# Patient Record
Sex: Female | Born: 1965 | Race: Black or African American | Hispanic: No | Marital: Married | Smoking: Current every day smoker
Health system: Southern US, Community
[De-identification: ages and names within clinical notes are randomized; demographics above are authoritative.]

---

## 2002-01-24 ENCOUNTER — Emergency Department (HOSPITAL_COMMUNITY): Admission: EM | Admit: 2002-01-24 | Discharge: 2002-01-24 | Payer: Self-pay | Admitting: Emergency Medicine

## 2014-10-17 ENCOUNTER — Inpatient Hospital Stay (HOSPITAL_COMMUNITY)
Admission: EM | Admit: 2014-10-17 | Discharge: 2014-10-19 | DRG: 418 | Disposition: A | Discharge status: Home / Self Care | Payer: BLUE CROSS/BLUE SHIELD | Attending: Internal Medicine | Admitting: Internal Medicine

## 2014-10-17 ENCOUNTER — Inpatient Hospital Stay (HOSPITAL_COMMUNITY): Payer: BLUE CROSS/BLUE SHIELD

## 2014-10-17 ENCOUNTER — Emergency Department (HOSPITAL_COMMUNITY): Payer: BLUE CROSS/BLUE SHIELD

## 2014-10-17 ENCOUNTER — Encounter (HOSPITAL_COMMUNITY): Payer: Self-pay | Admitting: Emergency Medicine

## 2014-10-17 DIAGNOSIS — Z419 Encounter for procedure for purposes other than remedying health state, unspecified: Secondary | ICD-10-CM

## 2014-10-17 DIAGNOSIS — K8062 Calculus of gallbladder and bile duct with acute cholecystitis without obstruction: Secondary | ICD-10-CM | POA: Diagnosis present

## 2014-10-17 DIAGNOSIS — E669 Obesity, unspecified: Secondary | ICD-10-CM | POA: Diagnosis present

## 2014-10-17 DIAGNOSIS — K8040 Calculus of bile duct with cholecystitis, unspecified, without obstruction: Secondary | ICD-10-CM | POA: Diagnosis not present

## 2014-10-17 DIAGNOSIS — F17210 Nicotine dependence, cigarettes, uncomplicated: Secondary | ICD-10-CM | POA: Diagnosis present

## 2014-10-17 DIAGNOSIS — R1011 Right upper quadrant pain: Secondary | ICD-10-CM

## 2014-10-17 DIAGNOSIS — R109 Unspecified abdominal pain: Secondary | ICD-10-CM | POA: Diagnosis present

## 2014-10-17 DIAGNOSIS — K8042 Calculus of bile duct with acute cholecystitis without obstruction: Secondary | ICD-10-CM | POA: Diagnosis present

## 2014-10-17 DIAGNOSIS — R7989 Other specified abnormal findings of blood chemistry: Secondary | ICD-10-CM

## 2014-10-17 DIAGNOSIS — Z6841 Body Mass Index (BMI) 40.0 and over, adult: Secondary | ICD-10-CM | POA: Diagnosis not present

## 2014-10-17 DIAGNOSIS — R945 Abnormal results of liver function studies: Secondary | ICD-10-CM

## 2014-10-17 LAB — CBC WITH DIFFERENTIAL/PLATELET
Basophils Absolute: 0.1 10*3/uL (ref 0.0–0.1)
Basophils Relative: 1 % (ref 0–1)
EOS ABS: 0.5 10*3/uL (ref 0.0–0.7)
EOS PCT: 6 % — AB (ref 0–5)
HCT: 41.4 % (ref 36.0–46.0)
HEMOGLOBIN: 13.6 g/dL (ref 12.0–15.0)
LYMPHS PCT: 35 % (ref 12–46)
Lymphs Abs: 2.8 10*3/uL (ref 0.7–4.0)
MCH: 31.3 pg (ref 26.0–34.0)
MCHC: 32.9 g/dL (ref 30.0–36.0)
MCV: 95.4 fL (ref 78.0–100.0)
Monocytes Absolute: 0.8 10*3/uL (ref 0.1–1.0)
Monocytes Relative: 10 % (ref 3–12)
NEUTROS ABS: 3.8 10*3/uL (ref 1.7–7.7)
NEUTROS PCT: 48 % (ref 43–77)
PLATELETS: 223 10*3/uL (ref 150–400)
RBC: 4.34 MIL/uL (ref 3.87–5.11)
RDW: 13.9 % (ref 11.5–15.5)
WBC: 8 10*3/uL (ref 4.0–10.5)

## 2014-10-17 LAB — COMPREHENSIVE METABOLIC PANEL
ALK PHOS: 163 U/L — AB (ref 38–126)
ALT: 515 U/L — AB (ref 14–54)
AST: 1012 U/L — AB (ref 15–41)
Albumin: 3.9 g/dL (ref 3.5–5.0)
Anion gap: 8 (ref 5–15)
BILIRUBIN TOTAL: 1.9 mg/dL — AB (ref 0.3–1.2)
BUN: 10 mg/dL (ref 6–20)
CALCIUM: 9.7 mg/dL (ref 8.9–10.3)
CO2: 27 mmol/L (ref 22–32)
Chloride: 104 mmol/L (ref 101–111)
Creatinine, Ser: 0.97 mg/dL (ref 0.44–1.00)
GFR calc non Af Amer: >60 mL/min (ref >60)
Glucose, Bld: 95 mg/dL (ref 65–99)
POTASSIUM: 4.1 mmol/L (ref 3.5–5.1)
Sodium: 139 mmol/L (ref 135–145)
TOTAL PROTEIN: 7.7 g/dL (ref 6.5–8.1)

## 2014-10-17 LAB — URINALYSIS, ROUTINE W REFLEX MICROSCOPIC
Glucose, UA: NEGATIVE mg/dL
Hgb urine dipstick: NEGATIVE
Ketones, ur: NEGATIVE mg/dL
Leukocytes, UA: NEGATIVE
Nitrite: NEGATIVE
PROTEIN: NEGATIVE mg/dL
Specific Gravity, Urine: 1.014 (ref 1.005–1.030)
UROBILINOGEN UA: 1 mg/dL (ref 0.0–1.0)
pH: 7.5 (ref 5.0–8.0)

## 2014-10-17 LAB — I-STAT TROPONIN, ED: TROPONIN I, POC: 0 ng/mL (ref 0.00–0.08)

## 2014-10-17 LAB — CREATININE, SERUM
Creatinine, Ser: 0.91 mg/dL (ref 0.44–1.00)
GFR calc Af Amer: >60 mL/min (ref >60)
GFR calc non Af Amer: >60 mL/min (ref >60)

## 2014-10-17 LAB — PREGNANCY, URINE
Preg Test, Ur: NEGATIVE
Preg Test, Ur: NEGATIVE

## 2014-10-17 LAB — ETHANOL

## 2014-10-17 LAB — PROTIME-INR
INR: 1.16 (ref 0.00–1.49)
Prothrombin Time: 15 seconds (ref 11.6–15.2)

## 2014-10-17 LAB — GAMMA GT: GGT: 280 U/L — ABNORMAL HIGH (ref 7–50)

## 2014-10-17 LAB — LIPASE, BLOOD: LIPASE: 23 U/L (ref 22–51)

## 2014-10-17 MED ORDER — GADOBENATE DIMEGLUMINE 529 MG/ML IV SOLN
20.0000 mL | Freq: Once | INTRAVENOUS | Status: AC | PRN
  Administered 2014-10-17: 20 mL via INTRAVENOUS

## 2014-10-17 MED ORDER — ONDANSETRON HCL 4 MG/2ML IJ SOLN: 4.0000 mg | Freq: Four times a day (QID) | INTRAMUSCULAR | Status: DC | PRN

## 2014-10-17 MED ORDER — HEPARIN SODIUM (PORCINE) 5000 UNIT/ML IJ SOLN
5000.0000 [IU] | Freq: Three times a day (TID) | INTRAMUSCULAR | Status: DC
  Administered 2014-10-17 – 2014-10-18 (×2): 5000 [IU] via SUBCUTANEOUS
  Filled 2014-10-17 (×5): qty 1

## 2014-10-17 MED ORDER — SODIUM CHLORIDE 0.9 % IV BOLUS (SEPSIS)
1000.0000 mL | Freq: Once | INTRAVENOUS | Status: AC
  Administered 2014-10-17: 1000 mL via INTRAVENOUS

## 2014-10-17 MED ORDER — SODIUM CHLORIDE 0.9 % IV SOLN
3.0000 g | Freq: Four times a day (QID) | INTRAVENOUS | Status: DC
  Administered 2014-10-17 – 2014-10-18 (×3): 3 g via INTRAVENOUS
  Filled 2014-10-17 (×5): qty 3

## 2014-10-17 MED ORDER — ONDANSETRON HCL 4 MG PO TABS: 4.0000 mg | ORAL_TABLET | Freq: Four times a day (QID) | ORAL | Status: DC | PRN

## 2014-10-17 MED ORDER — GUAIFENESIN-DM 100-10 MG/5ML PO SYRP: 5.0000 mL | ORAL_SOLUTION | ORAL | Status: DC | PRN

## 2014-10-17 MED ORDER — MORPHINE SULFATE 2 MG/ML IJ SOLN
2.0000 mg | INTRAMUSCULAR | Status: DC | PRN
  Administered 2014-10-18: 2 mg via INTRAVENOUS
  Filled 2014-10-17: qty 1

## 2014-10-17 MED ORDER — KCL IN DEXTROSE-NACL 10-5-0.45 MEQ/L-%-% IV SOLN
INTRAVENOUS | Status: DC
  Administered 2014-10-17: 23:00:00 via INTRAVENOUS
  Filled 2014-10-17 (×4): qty 1000

## 2014-10-17 NOTE — H&P (Signed)
Patient Demographics  Nicole Ross, is a 49 y.o. female  MRN: [id number]   DOB - 17-Apr-1966  Admit Date - 10/17/2014  Outpatient Primary MD for the patient is No PCP Per Patient   With History of -  History reviewed. No pertinent past medical history.    History reviewed. No pertinent past surgical history.  in for   Chief Complaint  Patient presents with  . Abdominal Pain     HPI  Nicole Ross  is a 49 y.o. female,  with no previous medical history but positive history of smoking, marijuana use and occasional alcohol use counseled to quit all who is in good state of health but started having epigastric abdominal pain sometime yesterday nitrate into her back, no exacerbating or relieving factors, denies any fever chills, no diarrhea nausea vomiting, symptoms continued so she came to the ER.  In the ER workup consistent with possible stone in the CBD versus passed stone, she had elevated liver enzymes, Gen. surgery was consulted who requested a GI input, GI Dr. Bosie Clos was consulted who requested a hospitalist admission.   Patient currently symptom free except for dull epigastric abdominal pain radiating to her back, pain is constant, started yesterday evening, no aggravating or relieving factors, associate with mild nausea. She denies any other medical issues, or take any prescription medications. Otherwise negative review of systems.    Review of Systems    In addition to the HPI above,  No Fever-chills, No Headache, No changes with Vision or hearing, No problems swallowing food or Liquids, No Chest pain, Cough or Shortness of Breath, As above Abdominal pain, No Nausea or Vommitting, Bowel movements are regular, No Blood in stool or Urine, No dysuria, No new skin rashes or bruises, No new joints  pains-aches,  No new weakness, tingling, numbness in any extremity, No recent weight gain or loss, No polyuria, polydypsia or polyphagia, No significant Mental Stressors.  A full 10 point Review of Systems was done, except as stated above, all other Review of Systems were negative.   Social History History  Substance Use Topics  . Smoking status: Current Every Day Smoker  . Smokeless tobacco: Not on file  . Alcohol Use: Yes    Lives - at home  Mobility - walks without assistance     Family History Mother died of acute pancreatitis  Prior to Admission medications   Not on File    No Known Allergies  Physical Exam  Vitals  Blood pressure 139/86, pulse 62, temperature 98.3 F (36.8 C), temperature source Oral, resp. rate 16, last menstrual period 10/03/2014, SpO2 100 %.   1. General obese middle-aged African-American female lying in bed in NAD,     2. Normal affect and insight, Not Suicidal or Homicidal, Awake Alert, Oriented X 3.  3. No F.N deficits, ALL C.Nerves Intact, Strength 5/5 all 4 extremities, Sensation intact all 4 extremities, Plantars down going.  4. Ears and Eyes appear Normal, Conjunctivae clear, PERRLA. Moist Oral Mucosa.  5. Supple Neck, No JVD, No cervical lymphadenopathy appriciated, No Carotid Bruits.  6. Symmetrical Chest wall movement, Good air movement bilaterally, CTAB.  7. RRR, No Gallops, Rubs or Murmurs, No Parasternal Heave.  8. Positive Bowel Sounds, Abdomen Soft, mild right upper quadrant tenderness, No organomegaly appriciated,No rebound -guarding or rigidity.  9.  No Cyanosis, Normal Skin Turgor, No Skin Rash or Bruise.  10. Good muscle tone,  joints appear normal , no effusions, Normal ROM.  11. No Palpable Lymph Nodes in Neck or Axillae     Data Review  CBC  Recent Labs Lab 10/17/14 1234  WBC 8.0  HGB 13.6  HCT 41.4  PLT 223  MCV 95.4  MCH 31.3  MCHC 32.9  RDW 13.9  LYMPHSABS 2.8  MONOABS 0.8  EOSABS 0.5    BASOSABS 0.1   ------------------------------------------------------------------------------------------------------------------  Chemistries   Recent Labs Lab 10/17/14 1234  NA 139  K 4.1  CL 104  CO2 27  GLUCOSE 95  BUN 10  CREATININE 0.97  CALCIUM 9.7  AST 1012*  ALT 515*  ALKPHOS 163*  BILITOT 1.9*   ------------------------------------------------------------------------------------------------------------------ CrCl cannot be calculated (Unknown ideal weight.). ------------------------------------------------------------------------------------------------------------------ No results for input(s): TSH, T4TOTAL, T3FREE, THYROIDAB in the last 72 hours.  Invalid input(s): FREET3   Coagulation profile No results for input(s): INR, PROTIME in the last 168 hours. ------------------------------------------------------------------------------------------------------------------- No results for input(s): DDIMER in the last 72 hours. -------------------------------------------------------------------------------------------------------------------  Cardiac Enzymes No results for input(s): CKMB, TROPONINI, MYOGLOBIN in the last 168 hours.  Invalid input(s): CK ------------------------------------------------------------------------------------------------------------------ Invalid input(s): POCBNP   ---------------------------------------------------------------------------------------------------------------  Urinalysis    Component Value Date/Time   COLORURINE AMBER* 10/17/2014 1209   APPEARANCEUR CLEAR 10/17/2014 1209   LABSPEC 1.014 10/17/2014 1209   PHURINE 7.5 10/17/2014 1209   GLUCOSEU NEGATIVE 10/17/2014 1209   HGBUR NEGATIVE 10/17/2014 1209   BILIRUBINUR SMALL* 10/17/2014 1209   KETONESUR NEGATIVE 10/17/2014 1209   PROTEINUR NEGATIVE 10/17/2014 1209   UROBILINOGEN 1.0 10/17/2014 1209   NITRITE NEGATIVE 10/17/2014 1209   LEUKOCYTESUR NEGATIVE  10/17/2014 1209    ----------------------------------------------------------------------------------------------------------------  Imaging results:   US Abdomen Complete  10/17/2014   CLINICAL DATA:  Abdominal pain since 10 p.m., elevated LFTs  EXAM: ULTRASOUND ABDOMEN COMPLETE  COMPARISON:  None.  FINDINGS: Gallbladder: Multiple gallstones measuring up to 1.5 cm. Mild gallbladder wall thickening. No pericholecystic fluid. Negative sonographic Murphy's sign.  Common bile duct: Diameter: 9 mm. Although not well visualized, there is possible debris/stones in the mid/distal common duct (image 29).  Liver: No focal lesion identified. Within normal limits in parenchymal echogenicity.  IVC: No abnormality visualized.  Pancreas: Visualized portion unremarkable.  Spleen: Size and appearance within normal limits.  Right Kidney: Length: 11.3 cm.  No mass or hydronephrosis.  Left Kidney: Length: 12.0 cm.  No mass or hydronephrosis.  Abdominal aorta: No aneurysm visualized.  Other findings: None.  IMPRESSION: Cholelithiasis with mild gallbladder wall thickening. No associated sonographic findings suggest acute cholecystitis.  Common duct is mildly prominent, measuring 9 mm proximally. No intrahepatic ductal dilatation.  Possible debris/stones in the mid/distal common duct, suggesting choledocholithiasis. In the setting of abnormal LFTs, consider ERCP or MRCP.   Electronically Signed   By: Charline Bills M.D.   On: 10/17/2014 16:17    My personal review of EKG: Rhythm NSR, Rate  60 /min, no Acute ST changes    Assessment & Plan  1. Abdominal pain secondary to  choledocholithiasis versus passed CBD stone. She will be able to a MedSurg bed, nothing by mouth, IV fluids and pain medications. Will get MRCP. GI to evaluate. For now place her on IV Unasyn. May need eventual ERCP and cholecystectomy.  Will check a urine pregnancy is kindly follow.   DVT Prophylaxis Heparin    AM Labs Ordered, also please  review Full Orders  Family Communication: Admission, patients condition and plan of care including tests being ordered have been discussed with the patient and daughter who indicate understanding and agree with the plan and Code Status.  Code Status Full  Likely DC to home  Condition Fair  Time spent in minutes : 35    Rahel Carlton K M.D on 10/17/2014 at 5:46 PM  Between 7am to 7pm - Pager - [phone]  After 7pm go to [email or web address] - password [email or web address]  [facility]  Office  [phone]

## 2014-10-17 NOTE — ED Provider Notes (Signed)
CSN: [id number]     Arrival date & time 10/17/14  1208 History   First MD Initiated Contact with Patient 10/17/14 1319     Chief Complaint  Patient presents with  . Abdominal Pain     (Consider location/radiation/quality/duration/timing/severity/associated sxs/prior Treatment) HPI Nicole Ross is a 49 year old female with no known past medical history who presents the ER complaining of right upper quadrant and epigastric abdominal pain. Patient reports last night she had a gradual onset of right upper quadrant and epigastric abdominal pain with associated vomiting. Patient reports the pain began gradually and was constant, she reports gradual worsening of it throughout the night. Patient reports several episodes of bilious vomiting this morning. Patient states throughout the day today after vomiting her pain has decreased. On exam, patient states her pain is tolerable at this time, however still slightly present. Patient denies any aggravating or alleviating factors of her pain. Patient reports associated nausea and vomiting, denies associated chest pain, shortness of breath, fever, diarrhea, constipation, dysuria.  History reviewed. No pertinent past medical history. History reviewed. No pertinent past surgical history. No family history on file. History  Substance Use Topics  . Smoking status: Current Every Day Smoker  . Smokeless tobacco: Not on file  . Alcohol Use: Yes   OB History    No data available     Review of Systems  Constitutional: Negative for fever.  HENT: Negative for trouble swallowing.   Eyes: Negative for visual disturbance.  Respiratory: Negative for shortness of breath.   Cardiovascular: Negative for chest pain.  Gastrointestinal: Positive for nausea, vomiting and abdominal pain.  Genitourinary: Negative for dysuria.  Musculoskeletal: Negative for neck pain.  Skin: Negative for rash.  Neurological: Negative for dizziness, weakness and numbness.   Psychiatric/Behavioral: Negative.       Allergies  Review of patient's allergies indicates no known allergies.  Home Medications   Prior to Admission medications   Not on File   BP 139/86 mmHg  Pulse 62  Temp(Src) 98.3 F (36.8 C) (Oral)  Resp 16  SpO2 100%  LMP 10/03/2014 Physical Exam  Constitutional: She is oriented to person, place, and time. She appears well-developed and well-nourished. No distress.  HENT:  Head: Normocephalic and atraumatic.  Mouth/Throat: Oropharynx is clear and moist. No oropharyngeal exudate.  Eyes: Right eye exhibits no discharge. Left eye exhibits no discharge. No scleral icterus.  Neck: Normal range of motion.  Cardiovascular: Normal rate, regular rhythm and normal heart sounds.   No murmur heard. Pulmonary/Chest: Effort normal and breath sounds normal. No respiratory distress.  Abdominal: Soft. Normal appearance. There is tenderness in the right upper quadrant and epigastric area. There is positive Murphy's sign. There is no rigidity, no rebound, no guarding and no tenderness at McBurney's point.  Musculoskeletal: Normal range of motion. She exhibits no edema or tenderness.  Neurological: She is alert and oriented to person, place, and time. She has normal strength. No cranial nerve deficit or sensory deficit. Coordination normal. GCS eye subscore is 4. GCS verbal subscore is 5. GCS motor subscore is 6.  Patient fully alert, answering questions appropriately in full, clear sentences. Cranial nerves II through XII grossly intact. Motor strength 5 out of 5 in all major muscle groups of upper and lower extremities. Distal sensation intact.   Skin: Skin is warm and dry. No rash noted. She is not diaphoretic.  Psychiatric: She has a normal mood and affect.  Nursing note and vitals reviewed.   ED Course  Procedures (  including critical care time) Labs Review Labs Reviewed  CBC WITH DIFFERENTIAL/PLATELET - Abnormal; Notable for the following:     Eosinophils Relative 6 (*)    All other components within normal limits  COMPREHENSIVE METABOLIC PANEL - Abnormal; Notable for the following:    AST 1012 (*)    ALT 515 (*)    Alkaline Phosphatase 163 (*)    Total Bilirubin 1.9 (*)    All other components within normal limits  URINALYSIS, ROUTINE W REFLEX MICROSCOPIC - Abnormal; Notable for the following:    Color, Urine AMBER (*)    Bilirubin Urine SMALL (*)    All other components within normal limits  LIPASE, BLOOD  PREGNANCY, URINE  I-STAT TROPOININ, ED    Imaging Review US Abdomen Complete  10/17/2014   CLINICAL DATA:  Abdominal pain since 10 p.m., elevated LFTs  EXAM: ULTRASOUND ABDOMEN COMPLETE  COMPARISON:  None.  FINDINGS: Gallbladder: Multiple gallstones measuring up to 1.5 cm. Mild gallbladder wall thickening. No pericholecystic fluid. Negative sonographic Murphy's sign.  Common bile duct: Diameter: 9 mm. Although not well visualized, there is possible debris/stones in the mid/distal common duct (image 29).  Liver: No focal lesion identified. Within normal limits in parenchymal echogenicity.  IVC: No abnormality visualized.  Pancreas: Visualized portion unremarkable.  Spleen: Size and appearance within normal limits.  Right Kidney: Length: 11.3 cm.  No mass or hydronephrosis.  Left Kidney: Length: 12.0 cm.  No mass or hydronephrosis.  Abdominal aorta: No aneurysm visualized.  Other findings: None.  IMPRESSION: Cholelithiasis with mild gallbladder wall thickening. No associated sonographic findings suggest acute cholecystitis.  Common duct is mildly prominent, measuring 9 mm proximally. No intrahepatic ductal dilatation.  Possible debris/stones in the mid/distal common duct, suggesting choledocholithiasis. In the setting of abnormal LFTs, consider ERCP or MRCP.   Electronically Signed   By: Charline Bills M.D.   On: 10/17/2014 16:17     EKG Interpretation None      MDM   Final diagnoses:  RUQ pain   Patient here with  right upper quadrant tenderness, biliary vomiting, elevated liver enzymes. Bilirubin elevated at 1.9.   US abdomen complete with impression: Cholelithiasis with mild gallbladder wall thickening. No associated sonographic findings suggest acute cholecystitis.  Common duct is mildly prominent, measuring 9 mm proximally. No intrahepatic ductal dilatation.  Possible debris/stones in the mid/distal common duct, suggesting choledocholithiasis. In the setting of abnormal LFTs, consider ERCP or MRCP.  Based on these findings and patient's workup here, presentation suspicious for choledocholithiasis. Spoke with Dr. Ezzard Standing with general surgery who recommends gastroenterology consult for ERCP versus MRCP primarily with admission to medicine. Dr. Ezzard Standing recommends possible cholecystectomy status post ERCP.  Patient signed out to Roxy Horseman, PA-C with plan to follow-up with gastroenterology and admit patient to medicine for choledocholithiasis, elevated liver enzymes.  Signed,  Ladona Mow, PA-C 4:41 PM     Ladona Mow, PA-C 10/17/14 1641  Purvis Sheffield, MD 10/18/14 [phone]

## 2014-10-17 NOTE — ED Notes (Signed)
Delay with ultrasound per tech.

## 2014-10-17 NOTE — ED Notes (Signed)
US at bedside

## 2014-10-17 NOTE — Progress Notes (Signed)
ANTIBIOTIC CONSULT NOTE - INITIAL  Pharmacy Consult for Unasyn Indication: Intra-abdominal infection  No Known Allergies  Patient Measurements: Height: 5\' 3"  (160 cm) Weight: 276 lb (125.193 kg) IBW/kg (Calculated) : 52.4  Vital Signs: Temp: 99.2 F (37.3 C) (05/22 2251) Temp Source: Oral (05/22 2251) BP: 113/78 mmHg (05/22 2251) Pulse Rate: 64 (05/22 2251) Intake/Output from previous day:   Intake/Output from this shift:    Labs:  Recent Labs  10/17/14 1234 10/17/14 2015  WBC 8.0  --   HGB 13.6  --   PLT 223  --   CREATININE 0.97 0.91   Estimated Creatinine Clearance: 99.4 mL/min (by C-G formula based on Cr of 0.91). No results for input(s): VANCOTROUGH, VANCOPEAK, VANCORANDOM, GENTTROUGH, GENTPEAK, GENTRANDOM, TOBRATROUGH, TOBRAPEAK, TOBRARND, AMIKACINPEAK, AMIKACINTROU, AMIKACIN in the last 72 hours.   Microbiology: No results found for this or any previous visit (from the past 720 hour(s)).  Medical History: History reviewed. No pertinent past medical history.  Medications:  Scheduled:  . ampicillin-sulbactam (UNASYN) IV  3 g Intravenous 4 times per day  . heparin  5,000 Units Subcutaneous 3 times per day   Infusions:  . dextrose 5 % and 0.45 % NaCl with KCl 10 mEq/L 100 mL/hr at 10/17/14 2313   Assessment:  [age] yr female with abdominal pain. Ultrasound shows cholelithiasis vs passed stone via common bile duct.  Pharmacy consulted to dose Unasyn for empiric coverage of intra-abdominal infection  Goal of Therapy:  Eradication of infection  Plan:  Unasyn 3gm IV q6h  Makayleigh Poliquin, Joselyn GlassmanLeann Trefz, PharmD 10/17/2014,11:44 PM

## 2014-10-17 NOTE — Consult Note (Signed)
Referring Provider: Dr. Romeo AppleHarrison Primary Care Physician:  No PCP Per Patient Primary Gastroenterologist:  Gentry FitzUnassigned  Reason for Consultation:  Gallstones; Abdominal pain  HPI: Nicole Ross is a 49 y.o. female with acute onset of epigastric and RUQ pain that radiated to her back last night with associated nausea and vomiting. Reports having similar pain 2 months ago. Denies any known history of gallstones prior to this admit. Abdominal pain was 10/10 and sharp at onset. Denies chest pain, shortness of breath, fevers, chills, diarrhea, melena, hematochezia. Uses alcohol occasionally and denies heavy alcohol use although her daughter reports her use has been more than occasional. +Marijuana and cigarettes. U/S showed gallstones and mild GB wall thickening without acute cholecystitis. Possible sludge/stones in the distal CBD and mild prominence of CBD up to 9 mm without intrahepatic dilation. TB 1.9, ALP 163, AST 1012, ALT 515, Lipase 23.    History reviewed. No pertinent past medical history.  History reviewed. No pertinent past surgical history.  Prior to Admission medications   Not on File    Scheduled Meds: Continuous Infusions: PRN Meds:.  Allergies as of 10/17/2014  . (No Known Allergies)    No family history on file.  History   Social History  . Marital Status: Married    Spouse Name: N/A  . Number of Children: N/A  . Years of Education: N/A   Occupational History  . Not on file.   Social History Main Topics  . Smoking status: Current Every Day Smoker  . Smokeless tobacco: Not on file  . Alcohol Use: Yes  . Drug Use: Yes    Special: Marijuana  . Sexual Activity: Not on file   Other Topics Concern  . Not on file   Social History Narrative  . No narrative on file    Review of Systems: All negative except as stated above in HPI.  Physical Exam: Vital signs: Filed Vitals:   10/17/14 1442  BP: 139/86  Pulse: 62  Temp: 98.3  Resp: 16     General:    Alert,  Morbidly obese, pleasant and cooperative in NAD Head: atraumatic Eyes: anicteric, pupils equal and reactive ENT: oropharynx clear Neck: supple, nontender Lungs:  Clear throughout to auscultation.   No wheezes, crackles, or rhonchi. No acute distress. Heart:  Regular rate and rhythm; no murmurs, clicks, rubs,  or gallops. Abdomen: epigastric and RUQ tenderness with guarding, soft, nondistended, +BS  Rectal:  Deferred Ext: no edema Neuro: alert, oriented Skin: no rash  GI:  Lab Results:  Recent Labs  10/17/14 1234  WBC 8.0  HGB 13.6  HCT 41.4  PLT 223   BMET  Recent Labs  10/17/14 1234  NA 139  K 4.1  CL 104  CO2 27  GLUCOSE 95  BUN 10  CREATININE 0.97  CALCIUM 9.7   LFT  Recent Labs  10/17/14 1234  PROT 7.7  ALBUMIN 3.9  AST 1012*  ALT 515*  ALKPHOS 163*  BILITOT 1.9*   PT/INR No results for input(s): LABPROT, INR in the last 72 hours.   Studies/Results: [place] Abdomen Complete  10/17/2014   CLINICAL DATA:  Abdominal pain since 10 p.m., elevated LFTs  EXAM: ULTRASOUND ABDOMEN COMPLETE  COMPARISON:  None.  FINDINGS: Gallbladder: Multiple gallstones measuring up to 1.5 cm. Mild gallbladder wall thickening. No pericholecystic fluid. Negative sonographic Murphy's sign.  Common bile duct: Diameter: 9 mm. Although not well visualized, there is possible debris/stones in the mid/distal common duct (image 29).  Liver: No  focal lesion identified. Within normal limits in parenchymal echogenicity.  IVC: No abnormality visualized.  Pancreas: Visualized portion unremarkable.  Spleen: Size and appearance within normal limits.  Right Kidney: Length: 11.3 cm.  No mass or hydronephrosis.  Left Kidney: Length: 12.0 cm.  No mass or hydronephrosis.  Abdominal aorta: No aneurysm visualized.  Other findings: None.  IMPRESSION: Cholelithiasis with mild gallbladder wall thickening. No associated sonographic findings suggest acute cholecystitis.  Common duct is mildly prominent,  measuring 9 mm proximally. No intrahepatic ductal dilatation.  Possible debris/stones in the mid/distal common duct, suggesting choledocholithiasis. In the setting of abnormal LFTs, consider ERCP or MRCP.   Electronically Signed   By: Charline Bills M.D.   On: 10/17/2014 16:17    Impression/Plan: 49 yo with acute onset of abdominal pain and U/S showing gallstones and question of CBD stones/sludge. Transaminases are markedly elevated and could be due to passage of a gallstone. Lipase normal. I doubt she has an obstructing CBD stone. Her alcohol use may be playing a role as well if she has alcoholic hepatitis with the marked transaminitis with minimally elevated ALP and TB. Will check GGT and alcohol level. Will do MRCP to further evaluate and look for any CBD stones and pancreatic inflammation. Follow LFTs. If no CBD stones or pancreatitis seen on MRCP, then would recommend cholecystectomy as the next step during this admit. [facility] will follow up tomorrow.    LOS: 0 days   Nicole Ross.  10/17/2014, 5:42 PM  Pager [phone]  If no answer or after 5 PM call [phone]

## 2014-10-17 NOTE — ED Notes (Signed)
Per pt, states mid abdominal pain that radiated to back while she was at work last night-slight pain now-vomited green fluid

## 2014-10-17 NOTE — ED Provider Notes (Signed)
5:02 PM  Patient signed out to me by Christell ConstantMintz, PA-C.    Patient with RUQ pain, nausea, and vomiting since last night.  Pain currently a 4/10.  Elevated LFTs and cholelithiasis without cholecystitis.  Possible choledochalithiasis.  Discussed with Dr. Ezzard StandingNewman from general surgery, who recommends GI for ERCP.  Patient discussed with Dr. Bosie ClosSchooler from GI who will consult after he gets out of OR, recommends admission to medicine.  Appreciate Dr. Thedore MinsSingh for admitting the patient.  Urine preg is negative.  Roxy Horsemanobert Adi Seales, PA-C 10/17/14 1801  Tilden FossaElizabeth Rees, MD 10/17/14 [phone]

## 2014-10-18 ENCOUNTER — Encounter (HOSPITAL_COMMUNITY): Payer: Self-pay

## 2014-10-18 ENCOUNTER — Inpatient Hospital Stay (HOSPITAL_COMMUNITY): Payer: BLUE CROSS/BLUE SHIELD | Admitting: Certified Registered Nurse Anesthetist

## 2014-10-18 ENCOUNTER — Inpatient Hospital Stay (HOSPITAL_COMMUNITY): Payer: BLUE CROSS/BLUE SHIELD

## 2014-10-18 ENCOUNTER — Encounter (HOSPITAL_COMMUNITY)
Admission: EM | Disposition: A | Discharge status: Home / Self Care | Payer: Self-pay | Source: Home / Self Care | Attending: Internal Medicine

## 2014-10-18 HISTORY — PX: CHOLECYSTECTOMY: SHX55

## 2014-10-18 LAB — CBC
HEMATOCRIT: 39.2 % (ref 36.0–46.0)
Hemoglobin: 12.6 g/dL (ref 12.0–15.0)
MCH: 30.6 pg (ref 26.0–34.0)
MCHC: 32.1 g/dL (ref 30.0–36.0)
MCV: 95.1 fL (ref 78.0–100.0)
PLATELETS: 199 10*3/uL (ref 150–400)
RBC: 4.12 MIL/uL (ref 3.87–5.11)
RDW: 14 % (ref 11.5–15.5)
WBC: 5.7 10*3/uL (ref 4.0–10.5)

## 2014-10-18 LAB — HEPATIC FUNCTION PANEL
ALK PHOS: 173 U/L — AB (ref 38–126)
ALT: 365 U/L — AB (ref 14–54)
AST: 349 U/L — ABNORMAL HIGH (ref 15–41)
Albumin: 3.5 g/dL (ref 3.5–5.0)
BILIRUBIN INDIRECT: 0.9 mg/dL (ref 0.3–0.9)
BILIRUBIN TOTAL: 1.5 mg/dL — AB (ref 0.3–1.2)
Bilirubin, Direct: 0.6 mg/dL — ABNORMAL HIGH (ref 0.1–0.5)
TOTAL PROTEIN: 6.7 g/dL (ref 6.5–8.1)

## 2014-10-18 LAB — BASIC METABOLIC PANEL
ANION GAP: 7 (ref 5–15)
BUN: 8 mg/dL (ref 6–20)
CALCIUM: 9.3 mg/dL (ref 8.9–10.3)
CO2: 26 mmol/L (ref 22–32)
Chloride: 106 mmol/L (ref 101–111)
Creatinine, Ser: 0.99 mg/dL (ref 0.44–1.00)
GLUCOSE: 99 mg/dL (ref 65–99)
Potassium: 4.1 mmol/L (ref 3.5–5.1)
SODIUM: 139 mmol/L (ref 135–145)

## 2014-10-18 SURGERY — LAPAROSCOPIC CHOLECYSTECTOMY WITH INTRAOPERATIVE CHOLANGIOGRAM
Anesthesia: General | Site: Abdomen

## 2014-10-18 MED ORDER — OXYCODONE HCL 5 MG PO TABS
5.0000 mg | ORAL_TABLET | Freq: Once | ORAL | Status: DC | PRN
Start: 2014-10-18 — End: 2014-10-18

## 2014-10-18 MED ORDER — PROMETHAZINE HCL 25 MG/ML IJ SOLN: 6.2500 mg | INTRAMUSCULAR | Status: DC | PRN

## 2014-10-18 MED ORDER — KETOROLAC TROMETHAMINE 30 MG/ML IJ SOLN
INTRAMUSCULAR | Status: AC
  Filled 2014-10-18: qty 1

## 2014-10-18 MED ORDER — DEXAMETHASONE SODIUM PHOSPHATE 10 MG/ML IJ SOLN
INTRAMUSCULAR | Status: AC
  Filled 2014-10-18: qty 1

## 2014-10-18 MED ORDER — MIDAZOLAM HCL 5 MG/5ML IJ SOLN
INTRAMUSCULAR | Status: DC | PRN
  Administered 2014-10-18 (×2): 1 mg via INTRAVENOUS

## 2014-10-18 MED ORDER — PROPOFOL 10 MG/ML IV BOLUS
INTRAVENOUS | Status: DC | PRN
  Administered 2014-10-18: 50 mg via INTRAVENOUS
  Administered 2014-10-18: 200 mg via INTRAVENOUS

## 2014-10-18 MED ORDER — METOCLOPRAMIDE HCL 5 MG/ML IJ SOLN
INTRAMUSCULAR | Status: AC
  Filled 2014-10-18: qty 2

## 2014-10-18 MED ORDER — BUPIVACAINE-EPINEPHRINE (PF) 0.5% -1:200000 IJ SOLN
INTRAMUSCULAR | Status: AC
  Filled 2014-10-18: qty 30

## 2014-10-18 MED ORDER — IOHEXOL 300 MG/ML  SOLN
INTRAMUSCULAR | Status: DC | PRN
  Administered 2014-10-18: 14 mL

## 2014-10-18 MED ORDER — KETOROLAC TROMETHAMINE 30 MG/ML IJ SOLN
30.0000 mg | Freq: Once | INTRAMUSCULAR | Status: AC | PRN
  Administered 2014-10-18: 30 mg via INTRAVENOUS

## 2014-10-18 MED ORDER — FENTANYL CITRATE (PF) 100 MCG/2ML IJ SOLN
INTRAMUSCULAR | Status: AC
  Filled 2014-10-18: qty 2

## 2014-10-18 MED ORDER — METOPROLOL TARTRATE 1 MG/ML IV SOLN
INTRAVENOUS | Status: DC | PRN
  Administered 2014-10-18 (×2): 2 mg via INTRAVENOUS

## 2014-10-18 MED ORDER — PROPOFOL 10 MG/ML IV BOLUS
INTRAVENOUS | Status: AC
  Filled 2014-10-18: qty 20

## 2014-10-18 MED ORDER — CHLORHEXIDINE GLUCONATE 4 % EX LIQD: 1.0000 "application " | Freq: Once | CUTANEOUS | Status: DC

## 2014-10-18 MED ORDER — METOPROLOL TARTRATE 1 MG/ML IV SOLN
INTRAVENOUS | Status: AC
  Filled 2014-10-18: qty 5

## 2014-10-18 MED ORDER — LACTATED RINGERS IR SOLN
Status: DC | PRN
  Administered 2014-10-18: 1000 mL

## 2014-10-18 MED ORDER — DEXAMETHASONE SODIUM PHOSPHATE 4 MG/ML IJ SOLN
INTRAMUSCULAR | Status: DC | PRN
  Administered 2014-10-18: 10 mg via INTRAVENOUS

## 2014-10-18 MED ORDER — LIDOCAINE HCL (CARDIAC) 20 MG/ML IV SOLN
INTRAVENOUS | Status: DC | PRN
  Administered 2014-10-18: 50 mg via INTRAVENOUS

## 2014-10-18 MED ORDER — HYDROMORPHONE HCL 1 MG/ML IJ SOLN
0.2500 mg | INTRAMUSCULAR | Status: DC | PRN
  Administered 2014-10-18: 0.25 mg via INTRAVENOUS
  Administered 2014-10-18 (×2): 0.5 mg via INTRAVENOUS
  Administered 2014-10-18: 0.25 mg via INTRAVENOUS
  Administered 2014-10-18: 0.5 mg via INTRAVENOUS

## 2014-10-18 MED ORDER — FENTANYL CITRATE (PF) 250 MCG/5ML IJ SOLN
INTRAMUSCULAR | Status: AC
  Filled 2014-10-18: qty 5

## 2014-10-18 MED ORDER — NEOSTIGMINE METHYLSULFATE 10 MG/10ML IV SOLN
INTRAVENOUS | Status: AC
  Filled 2014-10-18: qty 1

## 2014-10-18 MED ORDER — CISATRACURIUM BESYLATE (PF) 10 MG/5ML IV SOLN
INTRAVENOUS | Status: DC | PRN
  Administered 2014-10-18: 2 mg via INTRAVENOUS
  Administered 2014-10-18: 6 mg via INTRAVENOUS
  Administered 2014-10-18 (×2): 2 mg via INTRAVENOUS
  Administered 2014-10-18: 4 mg via INTRAVENOUS

## 2014-10-18 MED ORDER — BUPIVACAINE-EPINEPHRINE 0.5% -1:200000 IJ SOLN
INTRAMUSCULAR | Status: DC | PRN
  Administered 2014-10-18: 25 mL

## 2014-10-18 MED ORDER — ONDANSETRON HCL 4 MG/2ML IJ SOLN
INTRAMUSCULAR | Status: AC
  Filled 2014-10-18: qty 2

## 2014-10-18 MED ORDER — DOCUSATE SODIUM 100 MG PO CAPS: 100.0000 mg | ORAL_CAPSULE | Freq: Two times a day (BID) | ORAL | Status: DC | PRN

## 2014-10-18 MED ORDER — MIDAZOLAM HCL 2 MG/2ML IJ SOLN
INTRAMUSCULAR | Status: AC
  Filled 2014-10-18: qty 2

## 2014-10-18 MED ORDER — HYDROMORPHONE HCL 1 MG/ML IJ SOLN
INTRAMUSCULAR | Status: AC
  Filled 2014-10-18: qty 1

## 2014-10-18 MED ORDER — ROCURONIUM BROMIDE 100 MG/10ML IV SOLN
INTRAVENOUS | Status: AC
  Filled 2014-10-18: qty 1

## 2014-10-18 MED ORDER — CEFAZOLIN SODIUM-DEXTROSE 2-3 GM-% IV SOLR: 2.0000 g | INTRAVENOUS | Status: DC

## 2014-10-18 MED ORDER — ONDANSETRON HCL 4 MG/2ML IJ SOLN
INTRAMUSCULAR | Status: DC | PRN
  Administered 2014-10-18: 4 mg via INTRAVENOUS

## 2014-10-18 MED ORDER — PNEUMOCOCCAL VAC POLYVALENT 25 MCG/0.5ML IJ INJ
0.5000 mL | INJECTION | INTRAMUSCULAR | Status: DC
  Filled 2014-10-18: qty 0.5

## 2014-10-18 MED ORDER — 0.9 % SODIUM CHLORIDE (POUR BTL) OPTIME
TOPICAL | Status: DC | PRN
  Administered 2014-10-18: 14 mL

## 2014-10-18 MED ORDER — HYDROCODONE-ACETAMINOPHEN 5-325 MG PO TABS
1.0000 | ORAL_TABLET | ORAL | Status: DC | PRN
  Administered 2014-10-18 – 2014-10-19 (×3): 2 via ORAL
  Filled 2014-10-18 (×3): qty 2

## 2014-10-18 MED ORDER — GLYCOPYRROLATE 0.2 MG/ML IJ SOLN
INTRAMUSCULAR | Status: AC
  Filled 2014-10-18: qty 3

## 2014-10-18 MED ORDER — NEOSTIGMINE METHYLSULFATE 10 MG/10ML IV SOLN
INTRAVENOUS | Status: DC | PRN
  Administered 2014-10-18: 4 mg via INTRAVENOUS

## 2014-10-18 MED ORDER — LACTATED RINGERS IV SOLN
INTRAVENOUS | Status: DC | PRN
  Administered 2014-10-18 (×2): via INTRAVENOUS

## 2014-10-18 MED ORDER — HEPARIN SODIUM (PORCINE) 5000 UNIT/ML IJ SOLN
5000.0000 [IU] | Freq: Three times a day (TID) | INTRAMUSCULAR | Status: DC
  Administered 2014-10-19: 5000 [IU] via SUBCUTANEOUS
  Filled 2014-10-18 (×4): qty 1

## 2014-10-18 MED ORDER — SODIUM CHLORIDE 0.9 % IV SOLN
3.0000 g | Freq: Once | INTRAVENOUS | Status: AC
  Administered 2014-10-18: 3 g via INTRAVENOUS
  Filled 2014-10-18: qty 3

## 2014-10-18 MED ORDER — FENTANYL CITRATE (PF) 100 MCG/2ML IJ SOLN
INTRAMUSCULAR | Status: DC | PRN
  Administered 2014-10-18: 25 ug via INTRAVENOUS
  Administered 2014-10-18: 50 ug via INTRAVENOUS
  Administered 2014-10-18: 100 ug via INTRAVENOUS
  Administered 2014-10-18 (×3): 50 ug via INTRAVENOUS
  Administered 2014-10-18: 25 ug via INTRAVENOUS
  Administered 2014-10-18 (×2): 50 ug via INTRAVENOUS

## 2014-10-18 MED ORDER — SUCCINYLCHOLINE CHLORIDE 20 MG/ML IJ SOLN
INTRAMUSCULAR | Status: DC | PRN
  Administered 2014-10-18: 100 mg via INTRAVENOUS

## 2014-10-18 MED ORDER — GLYCOPYRROLATE 0.2 MG/ML IJ SOLN
INTRAMUSCULAR | Status: DC | PRN
  Administered 2014-10-18: 0.6 mg via INTRAVENOUS

## 2014-10-18 MED ORDER — LIDOCAINE HCL (CARDIAC) 20 MG/ML IV SOLN
INTRAVENOUS | Status: AC
  Filled 2014-10-18: qty 5

## 2014-10-18 MED ORDER — CISATRACURIUM BESYLATE 20 MG/10ML IV SOLN
INTRAVENOUS | Status: AC
  Filled 2014-10-18: qty 10

## 2014-10-18 MED ORDER — CEFAZOLIN SODIUM-DEXTROSE 2-3 GM-% IV SOLR
INTRAVENOUS | Status: AC
  Filled 2014-10-18: qty 50

## 2014-10-18 MED ORDER — KCL IN DEXTROSE-NACL 10-5-0.45 MEQ/L-%-% IV SOLN
INTRAVENOUS | Status: DC
  Administered 2014-10-18 – 2014-10-19 (×3): via INTRAVENOUS
  Filled 2014-10-18 (×3): qty 1000

## 2014-10-18 MED ORDER — METOCLOPRAMIDE HCL 5 MG/ML IJ SOLN
INTRAMUSCULAR | Status: DC | PRN
  Administered 2014-10-18: 10 mg via INTRAVENOUS

## 2014-10-18 MED ORDER — OXYCODONE HCL 5 MG/5ML PO SOLN: 5.0000 mg | Freq: Once | ORAL | Status: DC | PRN

## 2014-10-18 SURGICAL SUPPLY — 32 items
APPLIER CLIP ROT 10 11.4 M/L (STAPLE) ×3
BENZOIN TINCTURE PRP APPL 2/3 (GAUZE/BANDAGES/DRESSINGS) IMPLANT
CLIP APPLIE ROT 10 11.4 M/L (STAPLE) ×1 IMPLANT
CLOSURE WOUND 1/2 X4 (GAUZE/BANDAGES/DRESSINGS)
COVER MAYO STAND STRL (DRAPES) ×3 IMPLANT
DECANTER SPIKE VIAL GLASS SM (MISCELLANEOUS) ×3 IMPLANT
DERMABOND ADVANCED (GAUZE/BANDAGES/DRESSINGS) ×2
DERMABOND ADVANCED .7 DNX12 (GAUZE/BANDAGES/DRESSINGS) ×1 IMPLANT
DRAPE C-ARM 42X120 X-RAY (DRAPES) ×3 IMPLANT
DRAPE LAPAROSCOPIC ABDOMINAL (DRAPES) ×3 IMPLANT
ELECT REM PT RETURN 9FT ADLT (ELECTROSURGICAL) ×3
ELECTRODE REM PT RTRN 9FT ADLT (ELECTROSURGICAL) ×1 IMPLANT
GLOVE EUDERMIC 7 POWDERFREE (GLOVE) ×3 IMPLANT
GOWN STRL REUS W/TWL XL LVL3 (GOWN DISPOSABLE) ×12 IMPLANT
HEMOSTAT SNOW SURGICEL 2X4 (HEMOSTASIS) IMPLANT
HOVERMATT SINGLE USE (MISCELLANEOUS) ×3 IMPLANT
KIT BASIN OR (CUSTOM PROCEDURE TRAY) ×3 IMPLANT
LIQUID BAND (GAUZE/BANDAGES/DRESSINGS) ×3 IMPLANT
POUCH SPECIMEN RETRIEVAL 10MM (ENDOMECHANICALS) ×3 IMPLANT
SCISSORS LAP 5X35 DISP (ENDOMECHANICALS) ×3 IMPLANT
SET CHOLANGIOGRAPH MIX (MISCELLANEOUS) ×3 IMPLANT
SET IRRIG TUBING LAPAROSCOPIC (IRRIGATION / IRRIGATOR) ×3 IMPLANT
SLEEVE XCEL OPT CAN 5 100 (ENDOMECHANICALS) ×3 IMPLANT
STRIP CLOSURE SKIN 1/2X4 (GAUZE/BANDAGES/DRESSINGS) IMPLANT
SUT MNCRL AB 4-0 PS2 18 (SUTURE) ×6 IMPLANT
SUT VICRYL 0 UR6 27IN ABS (SUTURE) ×3 IMPLANT
TOWEL OR 17X26 10 PK STRL BLUE (TOWEL DISPOSABLE) ×3 IMPLANT
TOWEL OR NON WOVEN STRL DISP B (DISPOSABLE) ×3 IMPLANT
TRAY LAPAROSCOPIC (CUSTOM PROCEDURE TRAY) ×3 IMPLANT
TROCAR BLADELESS OPT 5 100 (ENDOMECHANICALS) ×3 IMPLANT
TROCAR XCEL BLUNT TIP 100MML (ENDOMECHANICALS) ×3 IMPLANT
TROCAR XCEL NON-BLD 11X100MML (ENDOMECHANICALS) ×6 IMPLANT

## 2014-10-18 NOTE — Progress Notes (Signed)
Patient discussed with my partner Dr. Bosie ClosSchooler and hospital computer chart reviewed including decreasing liver tests and op note and Intra-Op cholangiogram which was reviewed and please call us if we could be of any further assistance this hospital stay otherwise would recommend following liver tests back to normal as an outpatient and will check hospital computer chart tomorrow for repeat labs as well

## 2014-10-18 NOTE — Anesthesia Procedure Notes (Signed)
Procedure Name: Intubation Date/Time: 10/18/2014 10:15 AM Performed by: Ludwig LeanJONES, Nalleli Largent C Pre-anesthesia Checklist: Patient identified, Emergency Drugs available, Suction available and Patient being monitored Patient Re-evaluated:Patient Re-evaluated prior to inductionOxygen Delivery Method: Circle System Utilized Preoxygenation: Pre-oxygenation with 100% oxygen Intubation Type: IV induction Ventilation: Mask ventilation without difficulty Laryngoscope Size: Mac and 3 Grade View: Grade I Tube type: Oral Number of attempts: 1 Airway Equipment and Method: Oral airway Placement Confirmation: ETT inserted through vocal cords under direct vision,  positive ETCO2 and breath sounds checked- equal and bilateral Secured at: 21 cm Tube secured with: Tape Dental Injury: Teeth and Oropharynx as per pre-operative assessment

## 2014-10-18 NOTE — Anesthesia Postprocedure Evaluation (Signed)
  Anesthesia Post-op Note  Patient: Nicole Ross  Procedure(s) Performed: Procedure(s): LAPAROSCOPIC CHOLECYSTECTOMY WITH INTRAOPERATIVE CHOLANGIOGRAM (N/A)  Patient Location: PACU  Anesthesia Type:General  Level of Consciousness: awake and alert   Airway and Oxygen Therapy: Patient Spontanous Breathing  Post-op Pain: mild  Post-op Assessment: Post-op Vital signs reviewed  Post-op Vital Signs: Reviewed  Last Vitals:  Filed Vitals:   10/18/14 1348  BP: 161/75  Pulse: 48  Temp: 36.4 C  Resp: 15    Complications: No apparent anesthesia complications

## 2014-10-18 NOTE — Consult Note (Signed)
Reason for Consult:GALLSTONES Referring Physician: DR. Lala Lund  Nicole Ross is an 49 y.o. female.  HPI: 49 y/o female admitted 5/22 with 10/10 pain RUQ, pain going to Nicole Ross back. No relieving or exacerbating factors.  Nicole Ross has had one prior episode about 2 months ago, but it was not this bad.  Work up showed Tm 99.2, VSS.  AST, ALT, and T Bil up some.  GGT 280, Lipase 23.  WBC is normal. Abdominal ultrasound showed cholelithiasis wigt mild GB wall thickening, CBD 9 mm, no intrahepatic ductal dialation.  Possible debris/stones in the mid/distal common duct, suggesting choledocholithiasis. In the setting of abnormal LFTs, consider ERCP or MRCP.  Nicole Ross was seen by Dr. Michail Sermon and MRCP was obtained and shows: Cholelithiasis, without associated inflammatory changes on MRI to suggest acute cholecystitis. No intrahepatic or extrahepatic ductal dilatation. Common duct measures up to 4 mm. No choledocholithiasis is seen. 2.4 cm benign right adrenal adenoma. Today Nicole Ross labs show the LFT's are improving, Nicole Ross WBC remains normal.    History reviewed. No pertinent past medical history.  History reviewed. No pertinent past surgical history.  History reviewed. No pertinent family history.  Social History:  reports that Nicole Ross has been smoking Cigarettes.  Nicole Ross does not have any smokeless tobacco history on file. Nicole Ross reports that Nicole Ross drinks alcohol. Nicole Ross reports that Nicole Ross does not use illicit drugs. + tobacco use daily + THC use + Etoh use  Allergies: No Known Allergies  Prior to Admission medications   Not on File   . dextrose 5 % and 0.45 % NaCl with KCl 10 mEq/L 100 mL/hr at 10/17/14 2313    Current facility-administered medications:  .  Ampicillin-Sulbactam (UNASYN) 3 g in sodium chloride 0.9 % 100 mL IVPB, 3 g, Intravenous, 4 times per day, Berton Mount, RPH, 3 g at 10/18/14 8315 .  dextrose 5 % and 0.45 % NaCl with KCl 10 mEq/L infusion, , Intravenous, Continuous, Fanny Skates, MD, Last Rate:  100 mL/hr at 10/17/14 2313 .  guaiFENesin-dextromethorphan (ROBITUSSIN DM) 100-10 MG/5ML syrup 5 mL, 5 mL, Oral, Q4H PRN, Thurnell Lose, MD .  heparin injection 5,000 Units, 5,000 Units, Subcutaneous, 3 times per day, Thurnell Lose, MD, 5,000 Units at 10/18/14 [phone] .  morphine 2 MG/ML injection 2 mg, 2 mg, Intravenous, Q2H PRN, Thurnell Lose, MD .  ondansetron (ZOFRAN) tablet 4 mg, 4 mg, Oral, Q6H PRN **OR** ondansetron (ZOFRAN) injection 4 mg, 4 mg, Intravenous, Q6H PRN, Thurnell Lose, MD .  pneumococcal 23 valent vaccine (PNU-IMMUNE) injection 0.5 mL, 0.5 mL, Intramuscular, Tomorrow-1000, Thurnell Lose, MD   Results for orders placed or performed during the hospital encounter of 10/17/14 (from the past 48 hour(s))  Pregnancy, urine     Status: None   Collection Time: 10/17/14 12:00 PM  Result Value Ref Range   Preg Test, Ur NEGATIVE NEGATIVE    Comment:        THE SENSITIVITY OF THIS METHODOLOGY IS >20 mIU/mL.   Urinalysis, Routine w reflex microscopic     Status: Abnormal   Collection Time: 10/17/14 12:09 PM  Result Value Ref Range   Color, Urine AMBER (A) YELLOW    Comment: BIOCHEMICALS MAY BE AFFECTED BY COLOR   APPearance CLEAR CLEAR   Specific Gravity, Urine 1.014 1.005 - 1.030   pH 7.5 5.0 - 8.0   Glucose, UA NEGATIVE NEGATIVE mg/dL   Hgb urine dipstick NEGATIVE NEGATIVE   Bilirubin Urine SMALL (A) NEGATIVE  Ketones, ur NEGATIVE NEGATIVE mg/dL   Protein, ur NEGATIVE NEGATIVE mg/dL   Urobilinogen, UA 1.0 0.0 - 1.0 mg/dL   Nitrite NEGATIVE NEGATIVE   Leukocytes, UA NEGATIVE NEGATIVE    Comment: MICROSCOPIC NOT DONE ON URINES WITH NEGATIVE PROTEIN, BLOOD, LEUKOCYTES, NITRITE, OR GLUCOSE <1000 mg/dL.  CBC with Differential     Status: Abnormal   Collection Time: 10/17/14 12:34 PM  Result Value Ref Range   WBC 8.0 4.0 - 10.5 K/uL   RBC 4.34 3.87 - 5.11 MIL/uL   Hemoglobin 13.6 12.0 - 15.0 g/dL   HCT 41.4 36.0 - 46.0 %   MCV 95.4 78.0 - 100.0 fL   MCH 31.3  26.0 - 34.0 pg   MCHC 32.9 30.0 - 36.0 g/dL   RDW 13.9 11.5 - 15.5 %   Platelets 223 150 - 400 K/uL   Neutrophils Relative % 48 43 - 77 %   Lymphocytes Relative 35 12 - 46 %   Monocytes Relative 10 3 - 12 %   Eosinophils Relative 6 (H) 0 - 5 %   Basophils Relative 1 0 - 1 %   Neutro Abs 3.8 1.7 - 7.7 K/uL   Lymphs Abs 2.8 0.7 - 4.0 K/uL   Monocytes Absolute 0.8 0.1 - 1.0 K/uL   Eosinophils Absolute 0.5 0.0 - 0.7 K/uL   Basophils Absolute 0.1 0.0 - 0.1 K/uL   RBC Morphology STOMATOCYTES    Smear Review PLATELET CLUMPS NOTED ON SMEAR   Comprehensive metabolic panel     Status: Abnormal   Collection Time: 10/17/14 12:34 PM  Result Value Ref Range   Sodium 139 135 - 145 mmol/L   Potassium 4.1 3.5 - 5.1 mmol/L   Chloride 104 101 - 111 mmol/L   CO2 27 22 - 32 mmol/L   Glucose, Bld 95 65 - 99 mg/dL   BUN 10 6 - 20 mg/dL   Creatinine, Ser 0.97 0.44 - 1.00 mg/dL   Calcium 9.7 8.9 - 10.3 mg/dL   Total Protein 7.7 6.5 - 8.1 g/dL   Albumin 3.9 3.5 - 5.0 g/dL   AST 1012 (H) 15 - 41 U/L   ALT 515 (H) 14 - 54 U/L   Alkaline Phosphatase 163 (H) 38 - 126 U/L   Total Bilirubin 1.9 (H) 0.3 - 1.2 mg/dL   GFR calc non Af Amer >60 >60 mL/min   GFR calc Af Amer >60 >60 mL/min    Comment: (NOTE) The eGFR has been calculated using the CKD EPI equation. This calculation has not been validated in all clinical situations. eGFR's persistently <60 mL/min signify possible Chronic Kidney Disease.    Anion gap 8 5 - 15  Lipase, blood     Status: None   Collection Time: 10/17/14 12:39 PM  Result Value Ref Range   Lipase 23 22 - 51 U/L  I-stat troponin, ED     Status: None   Collection Time: 10/17/14  2:20 PM  Result Value Ref Range   Troponin i, poc 0.00 0.00 - 0.08 ng/mL   Comment 3            Comment: Due to the release kinetics of cTnI, a negative result within the first hours of the onset of symptoms does not rule out myocardial infarction with certainty. If myocardial infarction is still  suspected, repeat the test at appropriate intervals.   Pregnancy, urine     Status: None   Collection Time: 10/17/14  5:52 PM  Result Value Ref Range  Preg Test, Ur NEGATIVE NEGATIVE    Comment:        THE SENSITIVITY OF THIS METHODOLOGY IS >20 mIU/mL.   Gamma GT     Status: Abnormal   Collection Time: 10/17/14  6:18 PM  Result Value Ref Range   GGT 280 (H) 7 - 50 U/L    Comment: Performed at [facility]  Ethanol     Status: None   Collection Time: 10/17/14  8:15 PM  Result Value Ref Range   Alcohol, Ethyl (B) <5 <5 mg/dL    Comment:        LOWEST DETECTABLE LIMIT FOR SERUM ALCOHOL IS 11 mg/dL FOR MEDICAL PURPOSES ONLY   Creatinine, serum     Status: None   Collection Time: 10/17/14  8:15 PM  Result Value Ref Range   Creatinine, Ser 0.91 0.44 - 1.00 mg/dL   GFR calc non Af Amer >60 >60 mL/min   GFR calc Af Amer >60 >60 mL/min    Comment: (NOTE) The eGFR has been calculated using the CKD EPI equation. This calculation has not been validated in all clinical situations. eGFR's persistently <60 mL/min signify possible Chronic Kidney Disease.   Protime-INR     Status: None   Collection Time: 10/17/14  8:15 PM  Result Value Ref Range   Prothrombin Time 15.0 11.6 - 15.2 seconds   INR 1.16 0.00 - 6.60  Basic metabolic panel     Status: None   Collection Time: 10/18/14  5:20 AM  Result Value Ref Range   Sodium 139 135 - 145 mmol/L   Potassium 4.1 3.5 - 5.1 mmol/L   Chloride 106 101 - 111 mmol/L   CO2 26 22 - 32 mmol/L   Glucose, Bld 99 65 - 99 mg/dL   BUN 8 6 - 20 mg/dL   Creatinine, Ser 0.99 0.44 - 1.00 mg/dL   Calcium 9.3 8.9 - 10.3 mg/dL   GFR calc non Af Amer >60 >60 mL/min   GFR calc Af Amer >60 >60 mL/min    Comment: (NOTE) The eGFR has been calculated using the CKD EPI equation. This calculation has not been validated in all clinical situations. eGFR's persistently <60 mL/min signify possible Chronic Kidney Disease.    Anion gap 7 5 - 15  CBC      Status: None   Collection Time: 10/18/14  5:20 AM  Result Value Ref Range   WBC 5.7 4.0 - 10.5 K/uL   RBC 4.12 3.87 - 5.11 MIL/uL   Hemoglobin 12.6 12.0 - 15.0 g/dL   HCT 39.2 36.0 - 46.0 %   MCV 95.1 78.0 - 100.0 fL   MCH 30.6 26.0 - 34.0 pg   MCHC 32.1 30.0 - 36.0 g/dL   RDW 14.0 11.5 - 15.5 %   Platelets 199 150 - 400 K/uL  Hepatic function panel     Status: Abnormal   Collection Time: 10/18/14  5:37 AM  Result Value Ref Range   Total Protein 6.7 6.5 - 8.1 g/dL   Albumin 3.5 3.5 - 5.0 g/dL   AST 349 (H) 15 - 41 U/L   ALT 365 (H) 14 - 54 U/L   Alkaline Phosphatase 173 (H) 38 - 126 U/L   Total Bilirubin 1.5 (H) 0.3 - 1.2 mg/dL   Bilirubin, Direct 0.6 (H) 0.1 - 0.5 mg/dL   Indirect Bilirubin 0.9 0.3 - 0.9 mg/dL    US Abdomen Complete  10/17/2014   CLINICAL DATA:  Abdominal pain since 10  p.m., elevated LFTs  EXAM: ULTRASOUND ABDOMEN COMPLETE  COMPARISON:  None.  FINDINGS: Gallbladder: Multiple gallstones measuring up to 1.5 cm. Mild gallbladder wall thickening. No pericholecystic fluid. Negative sonographic Murphy's sign.  Common bile duct: Diameter: 9 mm. Although not well visualized, there is possible debris/stones in the mid/distal common duct (image 29).  Liver: No focal lesion identified. Within normal limits in parenchymal echogenicity.  IVC: No abnormality visualized.  Pancreas: Visualized portion unremarkable.  Spleen: Size and appearance within normal limits.  Right Kidney: Length: 11.3 cm.  No mass or hydronephrosis.  Left Kidney: Length: 12.0 cm.  No mass or hydronephrosis.  Abdominal aorta: No aneurysm visualized.  Other findings: None.  IMPRESSION: Cholelithiasis with mild gallbladder wall thickening. No associated sonographic findings suggest acute cholecystitis.  Common duct is mildly prominent, measuring 9 mm proximally. No intrahepatic ductal dilatation.  Possible debris/stones in the mid/distal common duct, suggesting choledocholithiasis. In the setting of abnormal LFTs,  consider ERCP or MRCP.   Electronically Signed   By: Julian Hy M.D.   On: 10/17/2014 16:17   Mr 3d Recon At Scanner  10/18/2014   CLINICAL DATA:  Epigastric abdominal pain, cholelithiasis with possible choledocholithiasis on ultrasound  EXAM: MRI ABDOMEN WITHOUT AND WITH CONTRAST (INCLUDING MRCP)  TECHNIQUE: Multiplanar multisequence MR imaging of the abdomen was performed both before and after the administration of intravenous contrast. Heavily T2-weighted images of the biliary and pancreatic ducts were obtained, and three-dimensional MRCP images were rendered by post processing.  CONTRAST:  42m MULTIHANCE GADOBENATE DIMEGLUMINE 529 MG/ML IV SOLN  COMPARISON:  Right upper quadrant ultrasound dated 10/17/2014  FINDINGS: Motion degraded images.  Lower chest:  Lung bases are clear.  Hepatobiliary: Liver is within normal limits. No suspicious/ enhancing hepatic lesions. No hepatic steatosis.  Multiple large gallstones with mild gallbladder wall thickening. Mild irregular outpouching of the superior gallbladder wall near in the gallbladder neck (series 9/ image 11; series 17/image 50). However, no associated inflammatory changes on MRI to suggest acute cholecystitis.  Attempted coronal MRCP images are markedly degraded and obliqued. However, coronal localizer images demonstrate a normal, non dilated common duct (series 9/image 12) measuring 3-4 mm in diameter. This is confirmed on axial T2 images, which demonstrate a normal common duct without filling defect (series 13, image 24-36).  No intrahepatic or extrahepatic ductal dilatation. No choledocholithiasis is seen.  Pancreas: Within normal limits. No peripancreatic inflammatory changes.  Spleen: Within normal limits.  Adrenals/Urinary Tract: 2.4 x 1.5 cm right adrenal nodule with signal loss on opposed phase imaging, compatible with a benign adrenal adenoma.  Left adrenal gland is within normal limits.  Kidneys within normal limits.  No hydronephrosis.   Stomach/Bowel: Stomach and visualized bowel are unremarkable.  Vascular/Lymphatic: No evidence of abdominal aortic aneurysm.  No suspicious abdominal lymphadenopathy.  Other: No abdominal ascites.  Musculoskeletal: No focal osseous lesions.  IMPRESSION: Cholelithiasis, without associated inflammatory changes on MRI to suggest acute cholecystitis.  No intrahepatic or extrahepatic ductal dilatation. Common duct measures up to 4 mm. No choledocholithiasis is seen.  2.4 cm benign right adrenal adenoma.   Electronically Signed   By: SJulian HyM.D.   On: 10/18/2014 07:57   Mr Abd W/wo Cm/mrcp  10/18/2014   CLINICAL DATA:  Epigastric abdominal pain, cholelithiasis with possible choledocholithiasis on ultrasound  EXAM: MRI ABDOMEN WITHOUT AND WITH CONTRAST (INCLUDING MRCP)  TECHNIQUE: Multiplanar multisequence MR imaging of the abdomen was performed both before and after the administration of intravenous contrast. Heavily T2-weighted images of  the biliary and pancreatic ducts were obtained, and three-dimensional MRCP images were rendered by post processing.  CONTRAST:  73m MULTIHANCE GADOBENATE DIMEGLUMINE 529 MG/ML IV SOLN  COMPARISON:  Right upper quadrant ultrasound dated 10/17/2014  FINDINGS: Motion degraded images.  Lower chest:  Lung bases are clear.  Hepatobiliary: Liver is within normal limits. No suspicious/ enhancing hepatic lesions. No hepatic steatosis.  Multiple large gallstones with mild gallbladder wall thickening. Mild irregular outpouching of the superior gallbladder wall near in the gallbladder neck (series 9/ image 11; series 17/image 50). However, no associated inflammatory changes on MRI to suggest acute cholecystitis.  Attempted coronal MRCP images are markedly degraded and obliqued. However, coronal localizer images demonstrate a normal, non dilated common duct (series 9/image 12) measuring 3-4 mm in diameter. This is confirmed on axial T2 images, which demonstrate a normal common duct  without filling defect (series 13, image 24-36).  No intrahepatic or extrahepatic ductal dilatation. No choledocholithiasis is seen.  Pancreas: Within normal limits. No peripancreatic inflammatory changes.  Spleen: Within normal limits.  Adrenals/Urinary Tract: 2.4 x 1.5 cm right adrenal nodule with signal loss on opposed phase imaging, compatible with a benign adrenal adenoma.  Left adrenal gland is within normal limits.  Kidneys within normal limits.  No hydronephrosis.  Stomach/Bowel: Stomach and visualized bowel are unremarkable.  Vascular/Lymphatic: No evidence of abdominal aortic aneurysm.  No suspicious abdominal lymphadenopathy.  Other: No abdominal ascites.  Musculoskeletal: No focal osseous lesions.  IMPRESSION: Cholelithiasis, without associated inflammatory changes on MRI to suggest acute cholecystitis.  No intrahepatic or extrahepatic ductal dilatation. Common duct measures up to 4 mm. No choledocholithiasis is seen.  2.4 cm benign right adrenal adenoma.   Electronically Signed   By: SJulian HyM.D.   On: 10/18/2014 07:57    Review of Systems  Constitutional: Negative for fever, chills, weight loss, malaise/fatigue and diaphoresis.  HENT: Negative.   Eyes: Negative.   Cardiovascular: Negative.   Gastrointestinal: Positive for nausea (Saturday PM, 10/16/14) and abdominal pain (RUQ going into mid back). Negative for heartburn, diarrhea, constipation, blood in stool and melena. Vomiting: 10/16/14.  Genitourinary: Negative.   Musculoskeletal: Negative.   Skin: Negative.   Neurological: Negative.  Negative for weakness.  Endo/Heme/Allergies: Negative.   Psychiatric/Behavioral: Negative.    Blood pressure 139/69, pulse 56, temperature 98.2 F (36.8 C), temperature source Oral, resp. rate 14, height 5' 3"  (1.6 m), weight 125.193 kg (276 lb), last menstrual period 10/03/2014, SpO2 100 %. Physical Exam  Constitutional: Nicole Ross is oriented to person, place, and time. Nicole Ross appears well-developed  and well-nourished. No distress.  HENT:  Head: Normocephalic and atraumatic.  Nose: Nose normal.  Eyes: Conjunctivae and EOM are normal. Right eye exhibits no discharge. Left eye exhibits no discharge. No scleral icterus.  Neck: Normal range of motion. Neck supple. No JVD present. No tracheal deviation present. No thyromegaly present.  Cardiovascular: Normal rate, regular rhythm, normal heart sounds and intact distal pulses.   No murmur heard. Respiratory: Effort normal and breath sounds normal. No respiratory distress. Nicole Ross has no wheezes. Nicole Ross has no rales. Nicole Ross exhibits no tenderness.  GI: Soft. Bowel sounds are normal. Nicole Ross exhibits no distension and no mass. There is tenderness (some on deep palpation RUQ). There is no rebound and no guarding.  Musculoskeletal: Nicole Ross exhibits no edema or tenderness.  Lymphadenopathy:    Nicole Ross has no cervical adenopathy.  Neurological: Nicole Ross is alert and oriented to person, place, and time.  Skin: Skin is warm and dry. No rash  noted. Nicole Ross is not diaphoretic. No erythema. No pallor.  Psychiatric: Nicole Ross has a normal mood and affect. Nicole Ross behavior is normal. Judgment and thought content normal.    Assessment/Plan: 1.  Cholelithiasis, possible cholecystitis 2.  Elevated LFT's  3.  Hx of ETOH, THC, tobacco use daily 4.  Body mass index is 48.9   Plan:  For surgery today.  Nicole Ross has been seen and examined by Dr. Dalbert Batman and he has discussed recommendations, risk and benefits with Nicole Ross and Nicole Ross daughter.     Sakia Schrimpf 10/18/2014, 8:27 AM

## 2014-10-18 NOTE — Anesthesia Preprocedure Evaluation (Addendum)
Anesthesia Evaluation  Patient identified by MRN, date of birth, ID band Patient awake    Reviewed: Allergy & Precautions, NPO status , Patient's Chart, lab work & pertinent test results  Airway Mallampati: II  TM Distance: >3 FB Neck ROM: Full    Dental   Pulmonary Current Smoker,  breath sounds clear to auscultation        Cardiovascular negative cardio ROS  Rhythm:Regular Rate:Normal     Neuro/Psych negative neurological ROS     GI/Hepatic negative GI ROS, Cholelithiasis   Endo/Other  Morbid obesity  Renal/GU negative Renal ROS     Musculoskeletal   Abdominal   Peds  Hematology negative hematology ROS (+)   Anesthesia Other Findings   Reproductive/Obstetrics                            Lab Results  Component Value Date   WBC 5.7 10/18/2014   HGB 12.6 10/18/2014   HCT 39.2 10/18/2014   MCV 95.1 10/18/2014   PLT 199 10/18/2014   Lab Results  Component Value Date   CREATININE 0.99 10/18/2014   BUN 8 10/18/2014   NA 139 10/18/2014   K 4.1 10/18/2014   CL 106 10/18/2014   CO2 26 10/18/2014    Anesthesia Physical Anesthesia Plan  ASA: III  Anesthesia Plan: General   Post-op Pain Management:    Induction: Intravenous  Airway Management Planned: Oral ETT  Additional Equipment:   Intra-op Plan:   Post-operative Plan: Extubation in OR  Informed Consent: I have reviewed the patients History and Physical, chart, labs and discussed the procedure including the risks, benefits and alternatives for the proposed anesthesia with the patient or authorized representative who has indicated his/her understanding and acceptance.   Dental advisory given  Plan Discussed with: CRNA  Anesthesia Plan Comments:         Anesthesia Quick Evaluation

## 2014-10-18 NOTE — Progress Notes (Signed)
Patient Demographics  Nicole Ross, is a 49 y.o. female, DOB - 04/21/1966, XLK:[id number]  Admit date - 10/17/2014   Admitting Physician Leroy SeaPrashant K Gerlene Glassburn, MD  Outpatient Primary MD for the patient is No PCP Per Patient  LOS - 1   Chief Complaint  Patient presents with  . Abdominal Pain        Subjective:   Nicole Ross today has, No headache, No chest pain, No abdominal pain - No Nausea, No new weakness tingling or numbness, No Cough - SOB.   Assessment & Plan    1. Abdominal pain with elevated liver enzymes secondary to passed CBD stone. Seen by GI, MRCP does not show any CBD stone, liver enzymes trending down, she is now symptom-free and pain-free, discussed with GI physician Dr. Bosie ClosSchooler on the day of admission plan was if CBD clear to proceed with cholecystectomy this admission. Have called general surgery and she will likely get laparoscopic cholecystectomy on 10/18/2014 .  She will be a moderate risk for adverse cardio pulmonary outcome during the perioperative period. This was explained clearly to patient and daughter. Boat accept the risks and moaned to proceed with surgery.    2. Hx of smoking, marijuana use and occasional alcohol intake. Counseled to quit.   3.Obesity. Outpatient follow-up with PCP for weight loss.     Code Status: Full  Family Communication: Daughter  Disposition Plan: Home 1-2 days   Consults  CCS, GI   Procedures  Lap Choly due 10-18-14   DVT Prophylaxis    Heparin    Lab Results  Component Value Date   PLT 199 10/18/2014    Medications  Scheduled Meds: . [MAR Hold] ampicillin-sulbactam (UNASYN) IV  3 g Intravenous 4 times per day  . [MAR Hold] heparin  5,000 Units Subcutaneous 3 times per day  . [MAR Hold] pneumococcal 23 valent vaccine  0.5  mL Intramuscular Tomorrow-1000   Continuous Infusions: . dextrose 5 % and 0.45 % NaCl with KCl 10 mEq/L 100 mL/hr at 10/17/14 2313   PRN Meds:.[MAR Hold] guaiFENesin-dextromethorphan, [MAR Hold]  morphine injection, [MAR Hold] ondansetron **OR** [MAR Hold] ondansetron (ZOFRAN) IV  Antibiotics     Anti-infectives    Start     Dose/Rate Route Frequency Ordered Stop   10/17/14 2200  [MAR Hold]  Ampicillin-Sulbactam (UNASYN) 3 g in sodium chloride 0.9 % 100 mL IVPB     (MAR Hold since 10/18/14 0938)   3 g 100 mL/hr over 60 Minutes Intravenous 4 times per day 10/17/14 2011          Objective:   Filed Vitals:   10/17/14 1852 10/17/14 2251 10/17/14 2319 10/18/14 0614  BP: 137/70 113/78  139/69  Pulse:  64  56  Temp:  99.2 F (37.3 C)  98.2 F (36.8 C)  TempSrc:  Oral  Oral  Resp:  16  14  Height:   5\' 3"  (1.6 m)   Weight:   125.193 kg (276 lb)   SpO2:  100%  100%    Wt Readings from Last 3 Encounters:  10/17/14 125.193 kg (276 lb)     Intake/Output Summary (Last 24 hours) at 10/18/14 0958 Last data filed at 10/18/14 0200  Gross per 24 hour  Intake 378.33 ml  Output      0 ml  Net 378.33 ml     Physical Exam  Awake Alert, Oriented X 3, No new F.N deficits, Normal affect [place].AT,PERRAL Supple Neck,No JVD, No cervical lymphadenopathy appriciated.  Symmetrical Chest wall movement, Good air movement bilaterally, CTAB RRR,No Gallops,Rubs or new Murmurs, No Parasternal Heave +ve B.Sounds, Abd Soft, No tenderness, No organomegaly appriciated, No rebound - guarding or rigidity. No Cyanosis, Clubbing or edema, No new Rash or bruise         Data Review   Micro Results No results found for this or any previous visit (from the past 240 hour(s)).  Radiology Reports US Abdomen Complete  10/17/2014   CLINICAL DATA:  Abdominal pain since 10 p.m., elevated LFTs  EXAM: ULTRASOUND ABDOMEN COMPLETE  COMPARISON:  None.  FINDINGS: Gallbladder: Multiple gallstones measuring up to 1.5  cm. Mild gallbladder wall thickening. No pericholecystic fluid. Negative sonographic Murphy's sign.  Common bile duct: Diameter: 9 mm. Although not well visualized, there is possible debris/stones in the mid/distal common duct (image 29).  Liver: No focal lesion identified. Within normal limits in parenchymal echogenicity.  IVC: No abnormality visualized.  Pancreas: Visualized portion unremarkable.  Spleen: Size and appearance within normal limits.  Right Kidney: Length: 11.3 cm.  No mass or hydronephrosis.  Left Kidney: Length: 12.0 cm.  No mass or hydronephrosis.  Abdominal aorta: No aneurysm visualized.  Other findings: None.  IMPRESSION: Cholelithiasis with mild gallbladder wall thickening. No associated sonographic findings suggest acute cholecystitis.  Common duct is mildly prominent, measuring 9 mm proximally. No intrahepatic ductal dilatation.  Possible debris/stones in the mid/distal common duct, suggesting choledocholithiasis. In the setting of abnormal LFTs, consider ERCP or MRCP.   Electronically Signed   By: Charline Bills M.D.   On: 10/17/2014 16:17   Mr 3d Recon At Scanner  10/18/2014   CLINICAL DATA:  Epigastric abdominal pain, cholelithiasis with possible choledocholithiasis on ultrasound  EXAM: MRI ABDOMEN WITHOUT AND WITH CONTRAST (INCLUDING MRCP)  TECHNIQUE: Multiplanar multisequence MR imaging of the abdomen was performed both before and after the administration of intravenous contrast. Heavily T2-weighted images of the biliary and pancreatic ducts were obtained, and three-dimensional MRCP images were rendered by post processing.  CONTRAST:  20mL MULTIHANCE GADOBENATE DIMEGLUMINE 529 MG/ML IV SOLN  COMPARISON:  Right upper quadrant ultrasound dated 10/17/2014  FINDINGS: Motion degraded images.  Lower chest:  Lung bases are clear.  Hepatobiliary: Liver is within normal limits. No suspicious/ enhancing hepatic lesions. No hepatic steatosis.  Multiple large gallstones with mild gallbladder  wall thickening. Mild irregular outpouching of the superior gallbladder wall near in the gallbladder neck (series 9/ image 11; series 17/image 50). However, no associated inflammatory changes on MRI to suggest acute cholecystitis.  Attempted coronal MRCP images are markedly degraded and obliqued. However, coronal localizer images demonstrate a normal, non dilated common duct (series 9/image 12) measuring 3-4 mm in diameter. This is confirmed on axial T2 images, which demonstrate a normal common duct without filling defect (series 13, image 24-36).  No intrahepatic or extrahepatic ductal dilatation. No choledocholithiasis is seen.  Pancreas: Within normal limits. No peripancreatic inflammatory changes.  Spleen: Within normal limits.  Adrenals/Urinary Tract: 2.4 x 1.5 cm right adrenal nodule with signal loss on opposed phase imaging, compatible with a benign adrenal adenoma.  Left adrenal gland is within normal limits.  Kidneys within normal limits.  No hydronephrosis.  Stomach/Bowel: Stomach and visualized bowel are unremarkable.  Vascular/Lymphatic: No evidence  of abdominal aortic aneurysm.  No suspicious abdominal lymphadenopathy.  Other: No abdominal ascites.  Musculoskeletal: No focal osseous lesions.  IMPRESSION: Cholelithiasis, without associated inflammatory changes on MRI to suggest acute cholecystitis.  No intrahepatic or extrahepatic ductal dilatation. Common duct measures up to 4 mm. No choledocholithiasis is seen.  2.4 cm benign right adrenal adenoma.   Electronically Signed   By: Charline Bills M.D.   On: 10/18/2014 07:57   Mr Abd W/wo Cm/mrcp  10/18/2014   CLINICAL DATA:  Epigastric abdominal pain, cholelithiasis with possible choledocholithiasis on ultrasound  EXAM: MRI ABDOMEN WITHOUT AND WITH CONTRAST (INCLUDING MRCP)  TECHNIQUE: Multiplanar multisequence MR imaging of the abdomen was performed both before and after the administration of intravenous contrast. Heavily T2-weighted images of the  biliary and pancreatic ducts were obtained, and three-dimensional MRCP images were rendered by post processing.  CONTRAST:  20mL MULTIHANCE GADOBENATE DIMEGLUMINE 529 MG/ML IV SOLN  COMPARISON:  Right upper quadrant ultrasound dated 10/17/2014  FINDINGS: Motion degraded images.  Lower chest:  Lung bases are clear.  Hepatobiliary: Liver is within normal limits. No suspicious/ enhancing hepatic lesions. No hepatic steatosis.  Multiple large gallstones with mild gallbladder wall thickening. Mild irregular outpouching of the superior gallbladder wall near in the gallbladder neck (series 9/ image 11; series 17/image 50). However, no associated inflammatory changes on MRI to suggest acute cholecystitis.  Attempted coronal MRCP images are markedly degraded and obliqued. However, coronal localizer images demonstrate a normal, non dilated common duct (series 9/image 12) measuring 3-4 mm in diameter. This is confirmed on axial T2 images, which demonstrate a normal common duct without filling defect (series 13, image 24-36).  No intrahepatic or extrahepatic ductal dilatation. No choledocholithiasis is seen.  Pancreas: Within normal limits. No peripancreatic inflammatory changes.  Spleen: Within normal limits.  Adrenals/Urinary Tract: 2.4 x 1.5 cm right adrenal nodule with signal loss on opposed phase imaging, compatible with a benign adrenal adenoma.  Left adrenal gland is within normal limits.  Kidneys within normal limits.  No hydronephrosis.  Stomach/Bowel: Stomach and visualized bowel are unremarkable.  Vascular/Lymphatic: No evidence of abdominal aortic aneurysm.  No suspicious abdominal lymphadenopathy.  Other: No abdominal ascites.  Musculoskeletal: No focal osseous lesions.  IMPRESSION: Cholelithiasis, without associated inflammatory changes on MRI to suggest acute cholecystitis.  No intrahepatic or extrahepatic ductal dilatation. Common duct measures up to 4 mm. No choledocholithiasis is seen.  2.4 cm benign right  adrenal adenoma.   Electronically Signed   By: Charline Bills M.D.   On: 10/18/2014 07:57     CBC  Recent Labs Lab 10/17/14 1234 10/18/14 0520  WBC 8.0 5.7  HGB 13.6 12.6  HCT 41.4 39.2  PLT 223 199  MCV 95.4 95.1  MCH 31.3 30.6  MCHC 32.9 32.1  RDW 13.9 14.0  LYMPHSABS 2.8  --   MONOABS 0.8  --   EOSABS 0.5  --   BASOSABS 0.1  --     Chemistries   Recent Labs Lab 10/17/14 1234 10/17/14 2015 10/18/14 0520 10/18/14 0537  NA 139  --  139  --   K 4.1  --  4.1  --   CL 104  --  106  --   CO2 27  --  26  --   GLUCOSE 95  --  99  --   BUN 10  --  8  --   CREATININE 0.97 0.91 0.99  --   CALCIUM 9.7  --  9.3  --  AST 1012*  --   --  349*  ALT 515*  --   --  365*  ALKPHOS 163*  --   --  173*  BILITOT 1.9*  --   --  1.5*   ------------------------------------------------------------------------------------------------------------------ estimated creatinine clearance is 91.4 mL/min (by C-G formula based on Cr of 0.99). ------------------------------------------------------------------------------------------------------------------ No results for input(s): HGBA1C in the last 72 hours. ------------------------------------------------------------------------------------------------------------------ No results for input(s): CHOL, HDL, LDLCALC, TRIG, CHOLHDL, LDLDIRECT in the last 72 hours. ------------------------------------------------------------------------------------------------------------------ No results for input(s): TSH, T4TOTAL, T3FREE, THYROIDAB in the last 72 hours.  Invalid input(s): FREET3 ------------------------------------------------------------------------------------------------------------------ No results for input(s): VITAMINB12, FOLATE, FERRITIN, TIBC, IRON, RETICCTPCT in the last 72 hours.  Coagulation profile  Recent Labs Lab 10/17/14 2015  INR 1.16    No results for input(s): DDIMER in the last 72 hours.  Cardiac Enzymes No  results for input(s): CKMB, TROPONINI, MYOGLOBIN in the last 168 hours.  Invalid input(s): CK ------------------------------------------------------------------------------------------------------------------ Invalid input(s): POCBNP   Time Spent in minutes   35   Payal Stanforth K M.D on 10/18/2014 at 9:58 AM  Between 7am to 7pm - Pager - [phone]  After 7pm go to [email or web address] - password [email or web address]  [facility]   Office  [phone]

## 2014-10-18 NOTE — Progress Notes (Signed)
Gen. Surgery:  I have interviewed and examined this patient. She is admitted with her second episode of biliary colic, possible acute cholecystitis. She is feeling much better this morning but still has a little bit of discomfort Denies prior abdominal surgery. Other than high BMI, and ongoing tobacco abuse, she has no comorbidities. She is not followed by a PCP. Her daughter is here with her. The patient works at [employer].  Exam reveals right upper quadrant tenderness, but this is mostly subjective. She is not distended and there is no mass. There are no scars on her abdomen.  Ultrasound shows gallstones and possible filling defects in the CBD. Initial bilirubin 1.9, AST 1012, ALT 515, alkaline phosphatase 163. These are trending down this morning. No leukocytosis. MRCP shows no filling defect in the CBD and the CBD is not dilated, 4 mm.  Assessment/plan: Chronic and possibly acute cholecystitis with cholelithiasis LFT elevation may be due to passage of CBD stone or simply due to inflammation from acute cholecystitis  We will plan to proceed with laparoscopic cholecystectomy with cholangiogram, possible open cholecystectomy today. Continue Unasyn. I discussed the indications, details, techniques, and numerous risk of the surgery with her. She's aware the risk of bleeding, infection, conversion to open laparotomy, bile leak, wound healing problems such as hernia, injury to adjacent organs such as the main bile duct are intestine with major reconstructive surgery. She understands all these issues. At this time all of her questions are answered. She very much was to go ahead with surgery today and she agrees with this plan.    Angelia MouldHaywood M. Derrell LollingIngram, M.D., [facility]ACS [facility] General and Minimally invasive Surgery Breast and Colorectal Surgery

## 2014-10-18 NOTE — Op Note (Signed)
Patient Name:           Nicole Ross   Date of Surgery:        10/18/2014  Pre op Diagnosis:      Acute cholecystitis with cholelithiasis, abnormal liver function tests.  Post op Diagnosis:    Same  Procedure:                 Laparoscopic cholecystectomy with cholangiogram  Surgeon:                     Angelia Mould. Derrell Lolling, M.D., FACS  Assistant:                      Aris [place], [place]  Operative Indications:   This is a 49 year old African-American female who was admitted to the hospital yesterday. This was following her second episode of upper abdominal pain radiating to her back. Her liver function tests were elevated. Ultrasound showed gallstones and possible filling defects in the CBD. MRCP showed no filling defect in the CBD and the CBD was said to be of normal caliber. She was seen by Dr. Bosie Clos who did not think she needed to have ERCP. This morning she feels better with less pain and tenderness. Liver function tests were trending downward. She is brought to operating room for cholecystectomy.  Operative Findings:       The CBD appeared to be significantly dilated and the anatomy was somewhat distorted. It took a long time to dissect out the cystic duct and the cystic artery but ultimately we were able to define the structures very carefully. When we clipped off the cystic artery we lost control for a couple of seconds and probably lost 15 or 20 mL of blood but were then able to control it with multiple clips. The gallbladder was somewhat thick walled, partially intrahepatic at the fundus, and contained numerous palpable gallstones. The cholangiogram showed somewhat dilated intrahepatic and extrahepatic biliary and ducts, but the duct tapered nicely down toward the duodenum and there was no obstruction with good flow of contrast into the duodenum. There is no filling defect seen. This was discussed with Dr. Richarda Overlie in radiology who felt that the Cholangiogram was basically normal. The liver,  stomach, duodenum, small intestine, large intestine were grossly normal to inspection.  At the completion of the case the irrigation fluid was completely clear and there was no evidence of bleeding or bile leak.  The liver bed was dry.  Procedure in Detail:          Following the induction of general endotracheal anesthesia, intravenous antibiotics were given. The abdomen was prepped and draped in a sterile fashion. Surgical timeout was performed. 0.5% Marcaine with epinephrine was used as a local infiltration anesthetic.      A vertical incision was made at the lower rim of the umbilicus. The fascia was incised in the midline and the abdominal cavity entered under direct vision. An 11 mm Hassan trocar was inserted and secured with a  Purstring suture of 0 Vicryl. Pneumoperitoneum was created. Video camera was inserted. An 11 mm trocar was placed in subxiphoid region Two 5 mm trochars placed in the right upper quadrant. We were able to grab the gallbladder fundus. We were able to divide the infundibulum and take a few adhesions down off of the infundibulum. We could see the common bile duct was very close to the infundibulum and was dilated. Very slowly incised the peritoneum  on the medial and lateral aspects of the neck of the gallbladder. We had to mobilize the lower third of the gallbladder to ultimately see the anatomy well and come off of the common bile duct and common hepatic duct. Once we did this we were able to isolate the cystic duct and cystic artery. We able to create a nice window behind the cystic duct and the cystic artery. The cystic artery was controlled with multiple medical clips and divided. I was dissecting a posterior branch of the cystic artery and we lost control of the cystic artery and it bled fairly profusely for a couple of seconds. I was able to grasp it. I  put  another trocar in the left subxiphoid area and I could pass the clip applier down and apply clips to the cystic duct again  and we got excellent control with 3 clips. I then controlled the posterior branch of the cystic artery and divided that. I then had a huge window behind the cystic duct. A cholangiogram catheter was inserted and the cystic duct and a cholangiogram obtained using the C-arm. The cholangiogram was normal as described above. Biliary tree was somewhat dilated. I postulated that she may have passed a common bile duct stone. The cholangiogram catheter was removed. The cystic duct was controlled with metal clips and divided. The gallbladder was dissected from its bed with electrocautery placed in a specimen bag and removed. The operative field and subphrenic space were copiously irrigated until the irrigation fluid was completely clear. I carefully inspected the areas of clip application to the cystic artery and cystic duct and it looked very good. I could visualize the common hepatic duct and common bile duct easily. I placed some omentum up in the gallbladder bed. Trochars were removed and pneumoperitoneum was released. The fascia at the umbilicus was closed with an interrupted suture of 0 Vicryl in a Purstring suture of 0 Vicryl. Skin incisions were closed with subcuticular sutures of 4-0 Monocryl and Dermabond. Patient tolerated the procedure well was taken to PACU in stable condition. EBL 30-40 mL. Counts correct. Complications none.     Angelia MouldHaywood M. Derrell LollingIngram, M.D., FACS General and Minimally Invasive Surgery Breast and Colorectal Surgery  10/18/2014 11:58 AM

## 2014-10-18 NOTE — Transfer of Care (Signed)
Immediate Anesthesia Transfer of Care Note  Patient: Nicole Ross  Procedure(s) Performed: Procedure(s): LAPAROSCOPIC CHOLECYSTECTOMY WITH INTRAOPERATIVE CHOLANGIOGRAM (N/A)  Patient Location: PACU  Anesthesia Type:General  Level of Consciousness: Patient easily awoken, sedated, comfortable, cooperative, following commands, responds to stimulation.   Airway & Oxygen Therapy: Patient spontaneously breathing, ventilating well, oxygen via simple oxygen mask.  Post-op Assessment: Report given to PACU RN, vital signs reviewed and stable, moving all extremities.   Post vital signs: Reviewed and stable.  Complications: No apparent anesthesia complications

## 2014-10-19 ENCOUNTER — Encounter (HOSPITAL_COMMUNITY): Payer: Self-pay | Admitting: General Surgery

## 2014-10-19 ENCOUNTER — Encounter: Payer: Self-pay | Admitting: General Surgery

## 2014-10-19 LAB — COMPREHENSIVE METABOLIC PANEL
ALBUMIN: 3.3 g/dL — AB (ref 3.5–5.0)
ALT: 262 U/L — AB (ref 14–54)
AST: 141 U/L — ABNORMAL HIGH (ref 15–41)
Alkaline Phosphatase: 153 U/L — ABNORMAL HIGH (ref 38–126)
Anion gap: 6 (ref 5–15)
BUN: 8 mg/dL (ref 6–20)
CO2: 27 mmol/L (ref 22–32)
CREATININE: 1.02 mg/dL — AB (ref 0.44–1.00)
Calcium: 9.1 mg/dL (ref 8.9–10.3)
Chloride: 104 mmol/L (ref 101–111)
GFR calc non Af Amer: >60 mL/min (ref >60)
GLUCOSE: 119 mg/dL — AB (ref 65–99)
POTASSIUM: 3.9 mmol/L (ref 3.5–5.1)
SODIUM: 137 mmol/L (ref 135–145)
TOTAL PROTEIN: 6.7 g/dL (ref 6.5–8.1)
Total Bilirubin: 0.6 mg/dL (ref 0.3–1.2)

## 2014-10-19 LAB — CBC
HEMATOCRIT: 37.4 % (ref 36.0–46.0)
Hemoglobin: 11.8 g/dL — ABNORMAL LOW (ref 12.0–15.0)
MCH: 30.3 pg (ref 26.0–34.0)
MCHC: 31.6 g/dL (ref 30.0–36.0)
MCV: 96.1 fL (ref 78.0–100.0)
Platelets: 167 10*3/uL (ref 150–400)
RBC: 3.89 MIL/uL (ref 3.87–5.11)
RDW: 14 % (ref 11.5–15.5)
WBC: 9.6 10*3/uL (ref 4.0–10.5)

## 2014-10-19 LAB — LIPASE, BLOOD: LIPASE: 14 U/L — AB (ref 22–51)

## 2014-10-19 MED ORDER — DOCUSATE SODIUM 100 MG PO CAPS: 200.0000 mg | ORAL_CAPSULE | Freq: Two times a day (BID) | ORAL | Status: DC | PRN

## 2014-10-19 MED ORDER — ACETAMINOPHEN 325 MG PO TABS: 650.0000 mg | ORAL_TABLET | ORAL | Status: DC | PRN

## 2014-10-19 MED ORDER — PROMETHAZINE HCL 12.5 MG PO TABS: 12.5000 mg | ORAL_TABLET | Freq: Four times a day (QID) | ORAL | Status: DC | PRN

## 2014-10-19 MED ORDER — HYDROCODONE-ACETAMINOPHEN 5-325 MG PO TABS: 1.0000 | ORAL_TABLET | Freq: Four times a day (QID) | ORAL | Status: DC | PRN

## 2014-10-19 NOTE — Plan of Care (Signed)
     Nicole Ross was admitted to the Hospital on 10/17/2014 and Discharged  10/19/2014 and should be excused from work/school   for  5  days starting 10/17/2014 , may return to work/school without any restrictions.  Call Nicole RaringPrashant Verniece Encarnacion MD, [facility]  [phone] with questions.  Nicole Ross,Nicole Ross K M.D on 10/19/2014,at 12:54 PM  [facility]   Office  [phone]

## 2014-10-19 NOTE — Progress Notes (Signed)
1 Day Post-Op  Subjective: She looks good and doing well after surgery, a little sore but otherwise no complaints.  Objective: Vital signs in last 24 hours: Temp:  [97.6 F (36.4 C)-98.7 F (37.1 C)] 98.2 F (36.8 C) (05/24 0554) Pulse Rate:  [40-63] 51 (05/24 0554) Resp:  [11-17] 14 (05/24 0221) BP: (121-161)/(59-89) 122/68 mmHg (05/24 0554) SpO2:  [100 %] 100 % (05/24 0554) Weight:  [121.8 kg (268 lb 8.3 oz)] 121.8 kg (268 lb 8.3 oz) (05/24 0428) Last BM Date: 10/16/14 360 PO Regular diet Afebrile, VSS LFT's improving IOC: Contrast opacification of the extrahepatic and intrahepatic ducts. Small filling defects are most compatible with air bubbles. No large obstructing stones. Contrast drains into the duodenum. There is some extravasation of contrast near the cystic duct cannulation site.  Intake/Output from previous day: 05/23 0701 - 05/24 0700 In: 3960 [P.O.:360; I.V.:3600] Out: 1050 [Urine:1050] Intake/Output this shift:    General appearance: alert, cooperative and no distress GI: soft , sore, port sites all look fine, she has glue, she is ordering breakfast now.    Lab Results:   Recent Labs  10/18/14 0520 10/19/14 0436  WBC 5.7 9.6  HGB 12.6 11.8*  HCT 39.2 37.4  PLT 199 167    BMET  Recent Labs  10/18/14 0520 10/19/14 0436  NA 139 137  K 4.1 3.9  CL 106 104  CO2 26 27  GLUCOSE 99 119*  BUN 8 8  CREATININE 0.99 1.02*  CALCIUM 9.3 9.1   PT/INR  Recent Labs  10/17/14 2015  LABPROT 15.0  INR 1.16     Recent Labs Lab 10/17/14 1234 10/18/14 0537 10/19/14 0436  AST 1012* 349* 141*  ALT 515* 365* 262*  ALKPHOS 163* 173* 153*  BILITOT 1.9* 1.5* 0.6  PROT 7.7 6.7 6.7  ALBUMIN 3.9 3.5 3.3*     Lipase     Component Value Date/Time   LIPASE 14* 10/19/2014 0436     Studies/Results: Dg Cholangiogram Operative  10/18/2014   CLINICAL DATA:  Cholelithiasis.  EXAM: INTRAOPERATIVE CHOLANGIOGRAM  TECHNIQUE: Cholangiographic images from  the C-arm fluoroscopic device were submitted for interpretation post-operatively. Please see the procedural report for the amount of contrast and the fluoroscopy time utilized.  COMPARISON:  MRI 10/17/2014  FINDINGS: Contrast opacification of the extrahepatic and intrahepatic ducts. Small filling defects are most compatible with air bubbles. No large obstructing stones. Contrast drains into the duodenum. There is some extravasation of contrast near the cystic duct cannulation site.  IMPRESSION: Patent biliary system.  No evidence for obstructing stones.  Contrast extravasation likely related to the cannulation site.   Electronically Signed   By: Richarda Overlie M.D.   On: 10/18/2014 11:58   US Abdomen Complete  10/17/2014   CLINICAL DATA:  Abdominal pain since 10 p.m., elevated LFTs  EXAM: ULTRASOUND ABDOMEN COMPLETE  COMPARISON:  None.  FINDINGS: Gallbladder: Multiple gallstones measuring up to 1.5 cm. Mild gallbladder wall thickening. No pericholecystic fluid. Negative sonographic Murphy's sign.  Common bile duct: Diameter: 9 mm. Although not well visualized, there is possible debris/stones in the mid/distal common duct (image 29).  Liver: No focal lesion identified. Within normal limits in parenchymal echogenicity.  IVC: No abnormality visualized.  Pancreas: Visualized portion unremarkable.  Spleen: Size and appearance within normal limits.  Right Kidney: Length: 11.3 cm.  No mass or hydronephrosis.  Left Kidney: Length: 12.0 cm.  No mass or hydronephrosis.  Abdominal aorta: No aneurysm visualized.  Other findings: None.  IMPRESSION:  Cholelithiasis with mild gallbladder wall thickening. No associated sonographic findings suggest acute cholecystitis.  Common duct is mildly prominent, measuring 9 mm proximally. No intrahepatic ductal dilatation.  Possible debris/stones in the mid/distal common duct, suggesting choledocholithiasis. In the setting of abnormal LFTs, consider ERCP or MRCP.   Electronically Signed   By:  Charline BillsSriyesh  Krishnan M.D.   On: 10/17/2014 16:17   Mr 3d Recon At Scanner  10/18/2014   CLINICAL DATA:  Epigastric abdominal pain, cholelithiasis with possible choledocholithiasis on ultrasound  EXAM: MRI ABDOMEN WITHOUT AND WITH CONTRAST (INCLUDING MRCP)  TECHNIQUE: Multiplanar multisequence MR imaging of the abdomen was performed both before and after the administration of intravenous contrast. Heavily T2-weighted images of the biliary and pancreatic ducts were obtained, and three-dimensional MRCP images were rendered by post processing.  CONTRAST:  20mL MULTIHANCE GADOBENATE DIMEGLUMINE 529 MG/ML IV SOLN  COMPARISON:  Right upper quadrant ultrasound dated 10/17/2014  FINDINGS: Motion degraded images.  Lower chest:  Lung bases are clear.  Hepatobiliary: Liver is within normal limits. No suspicious/ enhancing hepatic lesions. No hepatic steatosis.  Multiple large gallstones with mild gallbladder wall thickening. Mild irregular outpouching of the superior gallbladder wall near in the gallbladder neck (series 9/ image 11; series 17/image 50). However, no associated inflammatory changes on MRI to suggest acute cholecystitis.  Attempted coronal MRCP images are markedly degraded and obliqued. However, coronal localizer images demonstrate a normal, non dilated common duct (series 9/image 12) measuring 3-4 mm in diameter. This is confirmed on axial T2 images, which demonstrate a normal common duct without filling defect (series 13, image 24-36).  No intrahepatic or extrahepatic ductal dilatation. No choledocholithiasis is seen.  Pancreas: Within normal limits. No peripancreatic inflammatory changes.  Spleen: Within normal limits.  Adrenals/Urinary Tract: 2.4 x 1.5 cm right adrenal nodule with signal loss on opposed phase imaging, compatible with a benign adrenal adenoma.  Left adrenal gland is within normal limits.  Kidneys within normal limits.  No hydronephrosis.  Stomach/Bowel: Stomach and visualized bowel are  unremarkable.  Vascular/Lymphatic: No evidence of abdominal aortic aneurysm.  No suspicious abdominal lymphadenopathy.  Other: No abdominal ascites.  Musculoskeletal: No focal osseous lesions.  IMPRESSION: Cholelithiasis, without associated inflammatory changes on MRI to suggest acute cholecystitis.  No intrahepatic or extrahepatic ductal dilatation. Common duct measures up to 4 mm. No choledocholithiasis is seen.  2.4 cm benign right adrenal adenoma.   Electronically Signed   By: Charline BillsSriyesh  Krishnan M.D.   On: 10/18/2014 07:57   Mr Abd W/wo Cm/mrcp  10/18/2014   CLINICAL DATA:  Epigastric abdominal pain, cholelithiasis with possible choledocholithiasis on ultrasound  EXAM: MRI ABDOMEN WITHOUT AND WITH CONTRAST (INCLUDING MRCP)  TECHNIQUE: Multiplanar multisequence MR imaging of the abdomen was performed both before and after the administration of intravenous contrast. Heavily T2-weighted images of the biliary and pancreatic ducts were obtained, and three-dimensional MRCP images were rendered by post processing.  CONTRAST:  20mL MULTIHANCE GADOBENATE DIMEGLUMINE 529 MG/ML IV SOLN  COMPARISON:  Right upper quadrant ultrasound dated 10/17/2014  FINDINGS: Motion degraded images.  Lower chest:  Lung bases are clear.  Hepatobiliary: Liver is within normal limits. No suspicious/ enhancing hepatic lesions. No hepatic steatosis.  Multiple large gallstones with mild gallbladder wall thickening. Mild irregular outpouching of the superior gallbladder wall near in the gallbladder neck (series 9/ image 11; series 17/image 50). However, no associated inflammatory changes on MRI to suggest acute cholecystitis.  Attempted coronal MRCP images are markedly degraded and obliqued. However, coronal localizer images demonstrate  a normal, non dilated common duct (series 9/image 12) measuring 3-4 mm in diameter. This is confirmed on axial T2 images, which demonstrate a normal common duct without filling defect (series 13, image 24-36).   No intrahepatic or extrahepatic ductal dilatation. No choledocholithiasis is seen.  Pancreas: Within normal limits. No peripancreatic inflammatory changes.  Spleen: Within normal limits.  Adrenals/Urinary Tract: 2.4 x 1.5 cm right adrenal nodule with signal loss on opposed phase imaging, compatible with a benign adrenal adenoma.  Left adrenal gland is within normal limits.  Kidneys within normal limits.  No hydronephrosis.  Stomach/Bowel: Stomach and visualized bowel are unremarkable.  Vascular/Lymphatic: No evidence of abdominal aortic aneurysm.  No suspicious abdominal lymphadenopathy.  Other: No abdominal ascites.  Musculoskeletal: No focal osseous lesions.  IMPRESSION: Cholelithiasis, without associated inflammatory changes on MRI to suggest acute cholecystitis.  No intrahepatic or extrahepatic ductal dilatation. Common duct measures up to 4 mm. No choledocholithiasis is seen.  2.4 cm benign right adrenal adenoma.   Electronically Signed   By: Charline Bills M.D.   On: 10/18/2014 07:57    Medications: . heparin  5,000 Units Subcutaneous 3 times per day  . pneumococcal 23 valent vaccine  0.5 mL Intramuscular Tomorrow-1000    Assessment/Plan 1.Acute cholecystitis with cholelithiasis, abnormal liver function tests. S/p Laparoscopic cholecystectomy with cholangiogram  2. Elevated LFT's  3. Hx of ETOH, THC, tobacco use daily 4. Body mass index is 48.9    Plan:  Soft diet, low fat, mobilize and she can go home after lunch.  We talked about weight, and she is going to try a low fat diet.  I have info for follow up in our office in 3 weeks. LFT's are better. I will saline lock her IV and have post op instructions in AVS.  She has Vicodin and tylenol ordered for pain she can use this at home also.    LOS: 2 days    Annaston Upham 10/19/2014

## 2014-10-19 NOTE — Discharge Instructions (Signed)
Follow with Primary MD in 7 days   Get CBC, CMP, 2 view Chest X ray checked  by Primary MD next visit.    Activity: As tolerated with Full fall precautions use walker/cane & assistance as needed   Disposition Home     Diet: Heart Healthy    For Heart failure patients - Check your Weight same time everyday, if you gain over 2 pounds, or you develop in leg swelling, experience more shortness of breath or chest pain, call your Primary MD immediately. Follow Cardiac Low Salt Diet and 1.5 lit/day fluid restriction.   On your next visit with your primary care physician please Get Medicines reviewed and adjusted.   Please request your Prim.MD to go over all Hospital Tests and Procedure/Radiological results at the follow up, please get all Hospital records sent to your Prim MD by signing hospital release before you go home.   If you experience worsening of your admission symptoms, develop shortness of breath, life threatening emergency, suicidal or homicidal thoughts you must seek medical attention immediately by calling 911 or calling your MD immediately  if symptoms less severe.  You Must read complete instructions/literature along with all the possible adverse reactions/side effects for all the Medicines you take and that have been prescribed to you. Take any new Medicines after you have completely understood and accpet all the possible adverse reactions/side effects.   Do not drive, operating heavy machinery, perform activities at heights, swimming or participation in water activities or provide baby sitting services if your were admitted for syncope or siezures until you have seen by Primary MD or a Neurologist and advised to do so again.  Do not drive when taking Pain medications.    Do not take more than prescribed Pain, Sleep and Anxiety Medications  Special Instructions: If you have smoked or chewed Tobacco  in the last 2 yrs please stop smoking, stop any regular Alcohol  and or any  Recreational drug use.  Wear Seat belts while driving.   Please note  You were cared for by a hospitalist during your hospital stay. If you have any questions about your discharge medications or the care you received while you were in the hospital after you are discharged, you can call the unit and asked to speak with the hospitalist on call if the hospitalist that took care of you is not available. Once you are discharged, your primary care physician will handle any further medical issues. Please note that NO REFILLS for any discharge medications will be authorized once you are discharged, as it is imperative that you return to your primary care physician (or establish a relationship with a primary care physician if you do not have one) for your aftercare needs so that they can reassess your need for medications and monitor your lab values.            Laparoscopic Cholecystectomy, Care After Refer to this sheet in the next few weeks. These instructions provide you with information on caring for yourself after your procedure. Your health care provider may also give you more specific instructions. Your treatment has been planned according to current medical practices, but problems sometimes occur. Call your health care provider if you have any problems or questions after your procedure. WHAT TO EXPECT AFTER THE PROCEDURE After your procedure, it is typical to have the following:  Pain at your incision sites. You will be given pain medicines to control the pain.  Mild nausea or vomiting. This should  improve after the first 24 hours.  Bloating and possibly shoulder pain from the gas used during the procedure. This will improve after the first 24 hours. HOME CARE INSTRUCTIONS   Change bandages (dressings) as directed by your health care provider.  Keep the wound dry and clean. You may wash the wound gently with soap and water. Gently blot or dab the area dry.  Do not take baths or use  swimming pools or hot tubs for 2 weeks or until your health care provider approves.  Only take over-the-counter or prescription medicines as directed by your health care provider.  Continue your normal diet as directed by your health care provider.  Do not lift anything heavier than 10 pounds (4.5 kg) until your health care provider approves.  Do not play contact sports for 1 week or until your health care provider approves. SEEK MEDICAL CARE IF:   You have redness, swelling, or increasing pain in the wound.  You notice yellowish-white fluid (pus) coming from the wound.  You have drainage from the wound that lasts longer than 1 day.  You notice a bad smell coming from the wound or dressing.  Your surgical cuts (incisions) break open. SEEK IMMEDIATE MEDICAL CARE IF:   You develop a rash.  You have difficulty breathing.  You have chest pain.  You have a fever.  You have increasing pain in the shoulders (shoulder strap areas).  You have dizzy episodes or faint while standing.  You have severe abdominal pain.  You feel sick to your stomach (nauseous) or throw up (vomit) and this lasts for more than 1 day. Document Released: 05/14/2005 Document Revised: 03/04/2013 Document Reviewed: 12/24/2012 [facility] Patient Information 2015 [place], [place]. This information is not intended to replace advice given to you by your health care provider. Make sure you discuss any questions you have with your health care provider.  [facility] LAPAROSCOPIC SURGERY: POST OP INSTRUCTIONS Always review your discharge instruction sheet given to you by the facility where your surgery was performed. IF YOU HAVE DISABILITY OR FAMILY LEAVE FORMS, YOU MUST BRING THEM TO THE OFFICE FOR PROCESSING.   DO NOT GIVE THEM TO YOUR DOCTOR.  1. A prescription for pain medication may be given to you upon discharge.  Take your pain medication as prescribed, if needed.  If narcotic pain  medicine is not needed, then you may take acetaminophen (Tylenol) or ibuprofen (Advil) as needed. 2. Take your usually prescribed medications unless otherwise directed. 3. If you need a refill on your pain medication, please contact your pharmacy.  They will contact our office to request authorization. Prescriptions will not be filled after 5pm or on week-ends. 4. You should follow a light diet the first few days after arrival home, such as soup and crackers, etc.  Be sure to include lots of fluids daily. 5. Most patients will experience some swelling and bruising in the area of the incisions.  Ice packs will help.  Swelling and bruising can take several days to resolve.  6. It is common to experience some constipation if taking pain medication after surgery.  Increasing fluid intake and taking a stool softener (such as Colace) will usually help or prevent this problem from occurring.  A mild laxative (Milk of Magnesia or Miralax) should be taken according to package instructions if there are no bowel movements after 48 hours. 7. Unless discharge instructions indicate otherwise, you may remove your bandages 24-48 hours after surgery, and you may shower  at that time.  You may have steri-strips (small skin tapes) in place directly over the incision.  These strips should be left on the skin for 7-10 days.  If your surgeon used skin glue on the incision, you may shower in 24 hours.  The glue will flake off over the next 2-3 weeks.  Any sutures or staples will be removed at the office during your follow-up visit. 8. ACTIVITIES:  You may resume regular (light) daily activities beginning the next day--such as daily self-care, walking, climbing stairs--gradually increasing activities as tolerated.  You may have sexual intercourse when it is comfortable.  Refrain from any heavy lifting or straining until approved by your doctor. a. You may drive when you are no longer taking prescription pain medication, you can  comfortably wear a seatbelt, and you can safely maneuver your car and apply brakes. b. RETURN TO WORK:  __________________________________________________________ 9. You should see your doctor in the office for a follow-up appointment approximately 2-3 weeks after your surgery.  Make sure that you call for this appointment within a day or two after you arrive home to insure a convenient appointment time. 10. OTHER INSTRUCTIONS: __________________________________________________________________________________________________________________________ __________________________________________________________________________________________________________________________ WHEN TO CALL YOUR DOCTOR: 1. Fever over 101.0 2. Inability to urinate 3. Continued bleeding from incision. 4. Increased pain, redness, or drainage from the incision. 5. Increasing abdominal pain  The clinic staff is available to answer your questions during regular business hours.  Please dont hesitate to call and ask to speak to one of the nurses for clinical concerns.  If you have a medical emergency, go to the nearest emergency room or call 911.  A surgeon from [facility] is always on call at the hospital. [address], [place], [place]  [address] ? [address] [address], [place], [place]   [address] [phone] ? [phone] ? FAX [phone] Web site: [email or web address]  Cardiac Diet A cardiac diet can help stop heart disease or a stroke from happening. It involves eating less unhealthy fats and eating more healthy fats.  FOODS TO AVOID OR LIMIT  Limit saturated fats. This type of fat is found in oils and dairy products, such as:  Coconut oil.  Palm oil.  Cocoa butter.  Butter.  Avoid trans-fat or hydrogenated oils. These are found in fried or pre-made baked goods, such as:  Margarine.  Pre-made cookies, cakes, and crackers.  Limit processed meats (hot dogs, deli meats,  sausage) to 3 ounces a week.  Limit high-fat meats (marbled meats, fried chicken, or chicken with skin) to 3 ounces a week.  Limit salt (sodium) to 1500 milligrams a day.   Limit sweets and drinks with added sugar to no more than 5 servings a week. One serving is:  1 tablespoon of sugar.  1 tablespoon of jelly or jam.   cup sorbet.  1 cup lemonade.   cup regular soda. EAT MORE OF THE FOLLOWING FOODS Fruit  Eat 4to 5 servings a day. One serving of fruit is:  1 medium whole fruit.   cup dried fruit.   cup of fresh, frozen, or canned fruit.   cup 100% fruit juice. Vegetables  Eat 4 to 5 servings a day. One serving is:  1 cup raw leafy vegetables.   cup raw or cooked, cut-up vegetables.   cup vegetable juice. Whole Grains  Eat 3 servings a day (1 ounce equals 1 serving). Legumes (such as beans, peas, and lentils)   Eat at least 4 servings a  week ( cup equals 1 serving). Nuts and Seeds   Eat at least 4 servings a week ( cup equals 1 serving). Dietary Fiber  Eat 20 to 30 grams a day. Some foods high in dietary fiber include:  Dried beans.  Citrus fruits.  Apples, bananas.  Broccoli, Brussels sprouts, and eggplant.  Oats. Omega-3 Fats  Eat food with omega-3 fats. You can also take a dietary pill (supplement) that has 1 gram of DHA and EPA. Have 3.5 ounces of fatty fish a week, such as:  Salmon.  Mackerel.  Albacore tuna.  Sardines.  Lake trout.  Herring. PREPARING YOUR FOOD  Broil, bake, steam, or roast foods. Do not fry food. Do not cook food in butter (fat).  Use non-stick cooking sprays.  Remove skin from poultry, such as chicken and [place].  Remove fat from meat.  Take the fat off the top of stews, soups, and gravy.  Use lemon or herbs to flavor food instead of using butter or margarine.  Use nonfat yogurt, salsa, or low-fat dressings for salads. Document Released: 11/13/2011 Document Reviewed: 11/13/2011 [facility]  Patient Information 2015 [place], [place]. This information is not intended to replace advice given to you by your health care provider. Make sure you discuss any questions you have with your health care provider.

## 2014-10-19 NOTE — Plan of Care (Signed)
Problem: Food- and Nutrition-Related Knowledge Deficit (NB-1.1) Goal: Nutrition education Formal process to instruct or train a patient/client in a skill or to impart knowledge to help patients/clients voluntarily manage or modify food choices and eating behavior to maintain or improve health. Outcome: Completed/Met Date Met:  10/19/14 Nutrition Education Note  RD consulted for nutrition education regarding a low fat diet.  RD provided "Fat restricted Nutrition Therapy" handout from the Academy of [employer]etics. Reviewed patient's dietary recall. Provided examples on ways to decrease fat intake in diet. Discouraged intake of processed foods. Encouraged fresh fruits and vegetables as well as whole grain sources of carbohydrates to maximize fiber intake. Teach back method used.  Expect good compliance.  Body mass index is 47.58 kg/(m^2). Pt meets criteria for morbid obesity based on current BMI.  Current diet order is Heart Healthy, patient is consuming approximately 100% of meals at this time. Labs and medications reviewed. No further nutrition interventions warranted at this time. RD contact information provided. If additional nutrition issues arise, please re-consult RD.  Clayton Bibles, MS, RD, LDN Pager: [phone] After Hours Pager: [phone]

## 2014-10-19 NOTE — Discharge Summary (Signed)
Nicole Ross, is a 49 y.o. female  DOB 07/22/1965  MRN [id number].  Admission date:  10/17/2014  Admitting Physician  Leroy SeaPrashant K Story Vanvranken, MD  Discharge Date:  10/19/2014   Primary MD  No PCP Per Patient  Recommendations for primary care physician for things to follow:   Check CBC, CMP in a week.   Admission Diagnosis  RUQ pain [R10.11] Elevated LFTs [R79.89] Abdominal pain, acute [R10.0]   Discharge Diagnosis  RUQ pain [R10.11] Elevated LFTs [R79.89] Abdominal pain, acute [R10.0]     Principal Problem:   Choledocholithiasis with acute cholecystitis Active Problems:   Abdominal pain, acute      History reviewed. No pertinent past medical history.  History reviewed. No pertinent past surgical history.     History of present illness and  Hospital Course:     Kindly see H&P for history of present illness and admission details, please review complete Labs, Consult reports and Test reports for all details in brief  HPI  from the history and physical done on the day of admission  Nicole Ross is a 49 y.o. female, with no previous medical history but positive history of smoking, marijuana use and occasional alcohol use counseled to quit all who is in good state of health but started having epigastric abdominal pain sometime yesterday nitrate into her back, no exacerbating or relieving factors, denies any fever chills, no diarrhea nausea vomiting, symptoms continued so she came to the ER.  In the ER workup consistent with possible stone in the CBD versus passed stone, she had elevated liver enzymes, Gen. surgery was consulted who requested a GI input, GI Dr. Bosie ClosSchooler was consulted who requested a hospitalist admission.   Patient currently symptom free except for dull epigastric abdominal pain radiating to  her back, pain is constant, started yesterday evening, no aggravating or relieving factors, associate with mild nausea. She denies any other medical issues, or take any prescription medications. Otherwise negative review of systems.    Hospital Course    1. Abdominal pain with elevated liver enzymes secondary to passed CBD stone. Seen by GI, MRCP does not show any CBD stone, liver enzymes trending down, she is now symptom-free and pain-free, discussed with GI physician Dr. Bosie ClosSchooler on the day of admission plan was if CBD clear to proceed with cholecystectomy this admission. Seen by [facility] and post laparoscopic cholecystectomy on 10/18/2014, now tolerating diet symptom free. Cleared by surgery to be discharged home.    2. Hx of smoking, marijuana use and occasional alcohol intake. Counseled to quit all.    3.Obesity. Outpatient follow-up with PCP for weight loss.       Discharge Condition: Stable   Follow UP  Follow-up Information    Follow up with [facility] [place] On 11/02/2014.   Why:  You have an appointment with the D[facility] clinic at 3:30 PM, be at the office at 3:PM for check in.   Contact information:   [address] [place] [phone]  Follow up with [facility]. Schedule an appointment as soon as possible for a visit in 1 week.   Contact information:   [address] [place] [address] [phone]        Discharge Instructions  and  Discharge Medications      Discharge Instructions    Diet - low sodium heart healthy    Complete by:  As directed      Discharge instructions    Complete by:  As directed   Follow with Primary MD in 7 days   Get CBC, CMP, 2 view Chest X ray checked  by Primary MD next visit.    Activity: As tolerated with Full fall precautions use walker/cane & assistance as needed   Disposition Home     Diet: Heart Healthy    For Heart  failure patients - Check your Weight same time everyday, if you gain over 2 pounds, or you develop in leg swelling, experience more shortness of breath or chest pain, call your Primary MD immediately. Follow Cardiac Low Salt Diet and 1.5 lit/day fluid restriction.   On your next visit with your primary care physician please Get Medicines reviewed and adjusted.   Please request your Prim.MD to go over all Hospital Tests and Procedure/Radiological results at the follow up, please get all Hospital records sent to your Prim MD by signing hospital release before you go home.   If you experience worsening of your admission symptoms, develop shortness of breath, life threatening emergency, suicidal or homicidal thoughts you must seek medical attention immediately by calling 911 or calling your MD immediately  if symptoms less severe.  You Must read complete instructions/literature along with all the possible adverse reactions/side effects for all the Medicines you take and that have been prescribed to you. Take any new Medicines after you have completely understood and accpet all the possible adverse reactions/side effects.   Do not drive, operating heavy machinery, perform activities at heights, swimming or participation in water activities or provide baby sitting services if your were admitted for syncope or siezures until you have seen by Primary MD or a Neurologist and advised to do so again.  Do not drive when taking Pain medications.    Do not take more than prescribed Pain, Sleep and Anxiety Medications  Special Instructions: If you have smoked or chewed Tobacco  in the last 2 yrs please stop smoking, stop any regular Alcohol  and or any Recreational drug use.  Wear Seat belts while driving.   Please note  You were cared for by a hospitalist during your hospital stay. If you have any questions about your discharge medications or the care you received while you were in the hospital after  you are discharged, you can call the unit and asked to speak with the hospitalist on call if the hospitalist that took care of you is not available. Once you are discharged, your primary care physician will handle any further medical issues. Please note that NO REFILLS for any discharge medications will be authorized once you are discharged, as it is imperative that you return to your primary care physician (or establish a relationship with a primary care physician if you do not have one) for your aftercare needs so that they can reassess your need for medications and monitor your lab values.            Laparoscopic Cholecystectomy, Care After Refer to this sheet in the next few  weeks. These instructions provide you with information on caring for yourself after your procedure. Your health care provider may also give you more specific instructions. Your treatment has been planned according to current medical practices, but problems sometimes occur. Call your health care provider if you have any problems or questions after your procedure. WHAT TO EXPECT AFTER THE PROCEDURE After your procedure, it is typical to have the following: Pain at your incision sites. You will be given pain medicines to control the pain. Mild nausea or vomiting. This should improve after the first 24 hours. Bloating and possibly shoulder pain from the gas used during the procedure. This will improve after the first 24 hours. HOME CARE INSTRUCTIONS  Change bandages (dressings) as directed by your health care provider. Keep the wound dry and clean. You may wash the wound gently with soap and water. Gently blot or dab the area dry. Do not take baths or use swimming pools or hot tubs for 2 weeks or until your health care provider approves. Only take over-the-counter or prescription medicines as directed by your health care provider. Continue your normal diet as directed by your health care provider. Do not lift anything  heavier than 10 pounds (4.5 kg) until your health care provider approves. Do not play contact sports for 1 week or until your health care provider approves. SEEK MEDICAL CARE IF:  You have redness, swelling, or increasing pain in the wound. You notice yellowish-white fluid (pus) coming from the wound. You have drainage from the wound that lasts longer than 1 day. You notice a bad smell coming from the wound or dressing. Your surgical cuts (incisions) break open. SEEK IMMEDIATE MEDICAL CARE IF:  You develop a rash. You have difficulty breathing. You have chest pain. You have a fever. You have increasing pain in the shoulders (shoulder strap areas). You have dizzy episodes or faint while standing. You have severe abdominal pain. You feel sick to your stomach (nauseous) or throw up (vomit) and this lasts for more than 1 day. Document Released: 05/14/2005 Document Revised: 03/04/2013 Document Reviewed: 12/24/2012 [facility] Patient Information 2015 [place], [place]. This information is not intended to replace advice given to you by your health care provider. Make sure you discuss any questions you have with your health care provider.  [facility] ______[facility] LAPAROSCOPIC SURGERY: POST OP INSTRUCTIONS Always review your discharge instruction sheet given to you by the facility where your surgery was performed. IF YOU HAVE DISABILITY OR FAMILY LEAVE FORMS, YOU MUST BRING THEM TO THE OFFICE FOR PROCESSING.   DO NOT GIVE THEM TO YOUR DOCTOR.  A prescription for pain medication may be given to you upon discharge.  Take your pain medication as prescribed, if needed.  If narcotic pain medicine is not needed, then you may take acetaminophen (Tylenol) or ibuprofen (Advil) as needed. Take your usually prescribed medications unless otherwise directed. If you need a refill on your pain medication, please contact your pharmacy.  They will contact our office to request authorization.  Prescriptions will not be filled after 5pm or on week-ends. You should follow a light diet the first few days after arrival home, such as soup and crackers, etc.  Be sure to include lots of fluids daily. Most patients will experience some swelling and bruising in the area of the incisions.  Ice packs will help.  Swelling and bruising can take several days to resolve.  It is common to experience some constipation if taking pain medication after surgery.  Increasing fluid intake and taking a stool softener (such as Colace) will usually help or prevent this problem from occurring.  A mild laxative (Milk of Magnesia or Miralax) should be taken according to package instructions if there are no bowel movements after 48 hours. Unless discharge instructions indicate otherwise, you may remove your bandages 24-48 hours after surgery, and you may shower at that time.  You may have steri-strips (small skin tapes) in place directly over the incision.  These strips should be left on the skin for 7-10 days.  If your surgeon used skin glue on the incision, you may shower in 24 hours.  The glue will flake off over the next 2-3 weeks.  Any sutures or staples will be removed at the office during your follow-up visit. ACTIVITIES:  You may resume regular (light) daily activities beginning the next day-such as daily self-care, walking, climbing stairs-gradually increasing activities as tolerated.  You may have sexual intercourse when it is comfortable.  Refrain from any heavy lifting or straining until approved by your doctor. You may drive when you are no longer taking prescription pain medication, you can comfortably wear a seatbelt, and you can safely maneuver your car and apply brakes. RETURN TO WORK:  __________________________________________________________ Bonita Quin should see your doctor in the office for a follow-up appointment approximately 2-3 weeks after your surgery.  Make sure that you call for this appointment within a  day or two after you arrive home to insure a convenient appointment time. OTHER INSTRUCTIONS: __________________________________________________________________________________________________________________________ __________________________________________________________________________________________________________________________ WHEN TO CALL YOUR DOCTOR: Fever over 101.0 Inability to urinate Continued bleeding from incision. Increased pain, redness, or drainage from the incision. Increasing abdominal pain  The clinic staff is available to answer your questions during regular business hours.  Please don't hesitate to call and ask to speak to one of the nurses for clinical concerns.  If you have a medical emergency, go to the nearest emergency room or call 911.  A surgeon from [facility] is always on call at the hospital. [address], [address], [place], [place]  [address] ? [address] [address], [place], [place]   [address] [phone] ? [phone] ? FAX [phone] Web site: [email or web address]  Cardiac Diet A cardiac diet can help stop heart disease or a stroke from happening. It involves eating less unhealthy fats and eating more healthy fats.  FOODS TO AVOID OR LIMIT Limit saturated fats. This type of fat is found in oils and dairy products, such as: Coconut oil. Palm oil. Cocoa butter. Butter. Avoid trans-fat or hydrogenated oils. These are found in fried or pre-made baked goods, such as: Margarine. Pre-made cookies, cakes, and crackers. Limit processed meats (hot dogs, deli meats, sausage) to 3 ounces a week. Limit high-fat meats (marbled meats, fried chicken, or chicken with skin) to 3 ounces a week. Limit salt (sodium) to 1500 milligrams a day.  Limit sweets and drinks with added sugar to no more than 5 servings a week. One serving is: 1 tablespoon of sugar. 1 tablespoon of jelly or jam.  cup sorbet. 1 cup lemonade.  cup regular  soda. EAT MORE OF THE FOLLOWING FOODS Fruit Eat 4to 5 servings a day. One serving of fruit is: 1 medium whole fruit.  cup dried fruit.  cup of fresh, frozen, or canned fruit.  cup 100% fruit juice. Vegetables Eat 4 to 5 servings a day. One serving is: 1 cup raw leafy vegetables.  cup raw or cooked, cut-up vegetables.  cup vegetable juice. Whole  Grains Eat 3 servings a day (1 ounce equals 1 serving). Legumes (such as beans, peas, and lentils)  Eat at least 4 servings a week ( cup equals 1 serving). Nuts and Seeds  Eat at least 4 servings a week ( cup equals 1 serving). Dietary Fiber Eat 20 to 30 grams a day. Some foods high in dietary fiber include: Dried beans. Citrus fruits. Apples, bananas. Broccoli, Brussels sprouts, and eggplant. Oats. Omega-3 Fats Eat food with omega-3 fats. You can also take a dietary pill (supplement) that has 1 gram of DHA and EPA. Have 3.5 ounces of fatty fish a week, such as: Salmon. Mackerel. Albacore tuna. Sardines. Lake trout. Herring. PREPARING YOUR FOOD Broil, bake, steam, or roast foods. Do not fry food. Do not cook food in butter (fat). Use non-stick cooking sprays. Remove skin from poultry, such as chicken and [place]. Remove fat from meat. Take the fat off the top of stews, soups, and gravy. Use lemon or herbs to flavor food instead of using butter or margarine. Use nonfat yogurt, salsa, or low-fat dressings for salads. Document Released: 11/13/2011 Document Reviewed: 11/13/2011 [facility] Patient Information 2015 [place], [place]. This information is not intended to replace advice given to you by your health care provider. Make sure you discuss any questions you have with your health care provider.     Increase activity slowly    Complete by:  As directed             Medication List    TAKE these medications        docusate sodium 100 MG capsule  Commonly known as:  COLACE  Take 2 capsules (200 mg total) by mouth 2  (two) times daily as needed for mild constipation.     HYDROcodone-acetaminophen 5-325 MG per tablet  Commonly known as:  NORCO/VICODIN  Take 1 tablet by mouth every 6 (six) hours as needed for moderate pain or severe pain.     promethazine 12.5 MG tablet  Commonly known as:  PHENERGAN  Take 1 tablet (12.5 mg total) by mouth every 6 (six) hours as needed for nausea or vomiting.          Diet and Activity recommendation: See Discharge Instructions above   Consults obtained - GI, [facility]   Major procedures and Radiology Reports - PLEASE review detailed and final reports for all details, in brief -   Lap cholecystectomy by general surgery on 10/18/2014   Dg Cholangiogram Operative  10/18/2014   CLINICAL DATA:  Cholelithiasis.  EXAM: INTRAOPERATIVE CHOLANGIOGRAM  TECHNIQUE: Cholangiographic images from the C-arm fluoroscopic device were submitted for interpretation post-operatively. Please see the procedural report for the amount of contrast and the fluoroscopy time utilized.  COMPARISON:  MRI 10/17/2014  FINDINGS: Contrast opacification of the extrahepatic and intrahepatic ducts. Small filling defects are most compatible with air bubbles. No large obstructing stones. Contrast drains into the duodenum. There is some extravasation of contrast near the cystic duct cannulation site.  IMPRESSION: Patent biliary system.  No evidence for obstructing stones.  Contrast extravasation likely related to the cannulation site.   Electronically Signed   By: Richarda Overlie M.D.   On: 10/18/2014 11:58   US Abdomen Complete  10/17/2014   CLINICAL DATA:  Abdominal pain since 10 p.m., elevated LFTs  EXAM: ULTRASOUND ABDOMEN COMPLETE  COMPARISON:  None.  FINDINGS: Gallbladder: Multiple gallstones measuring up to 1.5 cm. Mild gallbladder wall thickening. No pericholecystic fluid. Negative sonographic Murphy's sign.  Common bile duct: Diameter: 9  mm. Although not well visualized, there is possible debris/stones in the  mid/distal common duct (image 29).  Liver: No focal lesion identified. Within normal limits in parenchymal echogenicity.  IVC: No abnormality visualized.  Pancreas: Visualized portion unremarkable.  Spleen: Size and appearance within normal limits.  Right Kidney: Length: 11.3 cm.  No mass or hydronephrosis.  Left Kidney: Length: 12.0 cm.  No mass or hydronephrosis.  Abdominal aorta: No aneurysm visualized.  Other findings: None.  IMPRESSION: Cholelithiasis with mild gallbladder wall thickening. No associated sonographic findings suggest acute cholecystitis.  Common duct is mildly prominent, measuring 9 mm proximally. No intrahepatic ductal dilatation.  Possible debris/stones in the mid/distal common duct, suggesting choledocholithiasis. In the setting of abnormal LFTs, consider ERCP or MRCP.   Electronically Signed   By: Charline Bills M.D.   On: 10/17/2014 16:17   Mr 3d Recon At Scanner  10/18/2014   CLINICAL DATA:  Epigastric abdominal pain, cholelithiasis with possible choledocholithiasis on ultrasound  EXAM: MRI ABDOMEN WITHOUT AND WITH CONTRAST (INCLUDING MRCP)  TECHNIQUE: Multiplanar multisequence MR imaging of the abdomen was performed both before and after the administration of intravenous contrast. Heavily T2-weighted images of the biliary and pancreatic ducts were obtained, and three-dimensional MRCP images were rendered by post processing.  CONTRAST:  20mL MULTIHANCE GADOBENATE DIMEGLUMINE 529 MG/ML IV SOLN  COMPARISON:  Right upper quadrant ultrasound dated 10/17/2014  FINDINGS: Motion degraded images.  Lower chest:  Lung bases are clear.  Hepatobiliary: Liver is within normal limits. No suspicious/ enhancing hepatic lesions. No hepatic steatosis.  Multiple large gallstones with mild gallbladder wall thickening. Mild irregular outpouching of the superior gallbladder wall near in the gallbladder neck (series 9/ image 11; series 17/image 50). However, no associated inflammatory changes on MRI to  suggest acute cholecystitis.  Attempted coronal MRCP images are markedly degraded and obliqued. However, coronal localizer images demonstrate a normal, non dilated common duct (series 9/image 12) measuring 3-4 mm in diameter. This is confirmed on axial T2 images, which demonstrate a normal common duct without filling defect (series 13, image 24-36).  No intrahepatic or extrahepatic ductal dilatation. No choledocholithiasis is seen.  Pancreas: Within normal limits. No peripancreatic inflammatory changes.  Spleen: Within normal limits.  Adrenals/Urinary Tract: 2.4 x 1.5 cm right adrenal nodule with signal loss on opposed phase imaging, compatible with a benign adrenal adenoma.  Left adrenal gland is within normal limits.  Kidneys within normal limits.  No hydronephrosis.  Stomach/Bowel: Stomach and visualized bowel are unremarkable.  Vascular/Lymphatic: No evidence of abdominal aortic aneurysm.  No suspicious abdominal lymphadenopathy.  Other: No abdominal ascites.  Musculoskeletal: No focal osseous lesions.  IMPRESSION: Cholelithiasis, without associated inflammatory changes on MRI to suggest acute cholecystitis.  No intrahepatic or extrahepatic ductal dilatation. Common duct measures up to 4 mm. No choledocholithiasis is seen.  2.4 cm benign right adrenal adenoma.   Electronically Signed   By: Charline Bills M.D.   On: 10/18/2014 07:57   Mr Abd W/wo Cm/mrcp  10/18/2014   CLINICAL DATA:  Epigastric abdominal pain, cholelithiasis with possible choledocholithiasis on ultrasound  EXAM: MRI ABDOMEN WITHOUT AND WITH CONTRAST (INCLUDING MRCP)  TECHNIQUE: Multiplanar multisequence MR imaging of the abdomen was performed both before and after the administration of intravenous contrast. Heavily T2-weighted images of the biliary and pancreatic ducts were obtained, and three-dimensional MRCP images were rendered by post processing.  CONTRAST:  20mL MULTIHANCE GADOBENATE DIMEGLUMINE 529 MG/ML IV SOLN  COMPARISON:  Right  upper quadrant ultrasound dated 10/17/2014  FINDINGS:  Motion degraded images.  Lower chest:  Lung bases are clear.  Hepatobiliary: Liver is within normal limits. No suspicious/ enhancing hepatic lesions. No hepatic steatosis.  Multiple large gallstones with mild gallbladder wall thickening. Mild irregular outpouching of the superior gallbladder wall near in the gallbladder neck (series 9/ image 11; series 17/image 50). However, no associated inflammatory changes on MRI to suggest acute cholecystitis.  Attempted coronal MRCP images are markedly degraded and obliqued. However, coronal localizer images demonstrate a normal, non dilated common duct (series 9/image 12) measuring 3-4 mm in diameter. This is confirmed on axial T2 images, which demonstrate a normal common duct without filling defect (series 13, image 24-36).  No intrahepatic or extrahepatic ductal dilatation. No choledocholithiasis is seen.  Pancreas: Within normal limits. No peripancreatic inflammatory changes.  Spleen: Within normal limits.  Adrenals/Urinary Tract: 2.4 x 1.5 cm right adrenal nodule with signal loss on opposed phase imaging, compatible with a benign adrenal adenoma.  Left adrenal gland is within normal limits.  Kidneys within normal limits.  No hydronephrosis.  Stomach/Bowel: Stomach and visualized bowel are unremarkable.  Vascular/Lymphatic: No evidence of abdominal aortic aneurysm.  No suspicious abdominal lymphadenopathy.  Other: No abdominal ascites.  Musculoskeletal: No focal osseous lesions.  IMPRESSION: Cholelithiasis, without associated inflammatory changes on MRI to suggest acute cholecystitis.  No intrahepatic or extrahepatic ductal dilatation. Common duct measures up to 4 mm. No choledocholithiasis is seen.  2.4 cm benign right adrenal adenoma.   Electronically Signed   By: Charline Bills M.D.   On: 10/18/2014 07:57    Micro Results      No results found for this or any previous visit (from the past 240  hour(s)).     Today   Subjective:   Nicole Ross today has no headache,no chest abdominal pain,no new weakness tingling or numbness, feels much better wants to go home today.    Objective:   Blood pressure 122/68, pulse 51, temperature 98.2 F (36.8 C), temperature source Oral, resp. rate 14, height 5\' 3"  (1.6 m), weight 121.8 kg (268 lb 8.3 oz), last menstrual period 10/03/2014, SpO2 100 %.   Intake/Output Summary (Last 24 hours) at 10/19/14 0911 Last data filed at 10/19/14 0600  Gross per 24 hour  Intake   3960 ml  Output   1050 ml  Net   2910 ml    Exam Awake Alert, Oriented x 3, No new F.N deficits, Normal affect .AT,PERRAL Supple Neck,No JVD, No cervical lymphadenopathy appriciated.  Symmetrical Chest wall movement, Good air movement bilaterally, CTAB RRR,No Gallops,Rubs or new Murmurs, No Parasternal Heave +ve B.Sounds, Abd Soft, Non tender, No organomegaly appriciated, No rebound -guarding or rigidity. No Cyanosis, Clubbing or edema, No new Rash or bruise  Data Review   CBC w Diff: Lab Results  Component Value Date   WBC 9.6 10/19/2014   HGB 11.8* 10/19/2014   HCT 37.4 10/19/2014   PLT 167 10/19/2014   LYMPHOPCT 35 10/17/2014   MONOPCT 10 10/17/2014   EOSPCT 6* 10/17/2014   BASOPCT 1 10/17/2014    CMP: Lab Results  Component Value Date   NA 137 10/19/2014   K 3.9 10/19/2014   CL 104 10/19/2014   CO2 27 10/19/2014   BUN 8 10/19/2014   CREATININE 1.02* 10/19/2014   PROT 6.7 10/19/2014   ALBUMIN 3.3* 10/19/2014   BILITOT 0.6 10/19/2014   ALKPHOS 153* 10/19/2014   AST 141* 10/19/2014   ALT 262* 10/19/2014  .   Total Time in preparing paper  work, Patent examiner and todays exam - 35 minutes  Leroy Sea M.D on 10/19/2014 at 9:11 AM  [facility]   Office  [phone]

## 2014-10-19 NOTE — Progress Notes (Signed)
Gen. Surgery:  Patient feeling well this morning. Minimal pain. Ambulating to bathroom and voiding uneventfully. ate a [place] breast last night. No nausea. I explained operative findings to the patient. Morning lab work is drawn but results are pending.  On exam, her abdomen is soft. Nondistended, minimally tender. Wounds look fine. Basically benign postop exam.  Assessment/plan:  POD #1. Laparoscopic cholecystectomy with cholangiogram. Stable. Low-fat diet ambulating halls check lab work  Probably home today. Will reassess later this morning after lab work back   General DynamicsHaywood M. Derrell LollingIngram, M.D., [facility] General and Minimally invasive Surgery Breast and Colorectal Surgery Office:   [phone] Pager:   [phone]

## 2015-10-03 ENCOUNTER — Emergency Department (HOSPITAL_COMMUNITY)
Admission: EM | Admit: 2015-10-03 | Discharge: 2015-10-04 | Disposition: A | Discharge status: Home / Self Care | Payer: Self-pay | Attending: Emergency Medicine | Admitting: Emergency Medicine

## 2015-10-03 ENCOUNTER — Encounter (HOSPITAL_COMMUNITY): Payer: Self-pay | Admitting: Emergency Medicine

## 2015-10-03 DIAGNOSIS — F17210 Nicotine dependence, cigarettes, uncomplicated: Secondary | ICD-10-CM | POA: Insufficient documentation

## 2015-10-03 DIAGNOSIS — B9689 Other specified bacterial agents as the cause of diseases classified elsewhere: Secondary | ICD-10-CM

## 2015-10-03 DIAGNOSIS — Z3202 Encounter for pregnancy test, result negative: Secondary | ICD-10-CM | POA: Insufficient documentation

## 2015-10-03 DIAGNOSIS — N340 Urethral abscess: Secondary | ICD-10-CM | POA: Insufficient documentation

## 2015-10-03 DIAGNOSIS — N760 Acute vaginitis: Secondary | ICD-10-CM | POA: Insufficient documentation

## 2015-10-03 NOTE — ED Notes (Signed)
Pt states that she has had swelling in her vaginal canal x approx. 1 week but worse in the past two days. Alert and oriented. Denies discharge.

## 2015-10-04 ENCOUNTER — Emergency Department (HOSPITAL_COMMUNITY): Payer: Self-pay

## 2015-10-04 LAB — GC/CHLAMYDIA PROBE AMP (~~LOC~~) NOT AT ARMC
Chlamydia: NEGATIVE
Neisseria Gonorrhea: NEGATIVE

## 2015-10-04 LAB — CBC WITH DIFFERENTIAL/PLATELET
BASOS ABS: 0 10*3/uL (ref 0.0–0.1)
BASOS PCT: 0 %
EOS PCT: 11 %
Eosinophils Absolute: 1.4 10*3/uL — ABNORMAL HIGH (ref 0.0–0.7)
HEMATOCRIT: 42.5 % (ref 36.0–46.0)
HEMOGLOBIN: 14 g/dL (ref 12.0–15.0)
LYMPHS PCT: 24 %
Lymphs Abs: 3.1 10*3/uL (ref 0.7–4.0)
MCH: 31.3 pg (ref 26.0–34.0)
MCHC: 32.9 g/dL (ref 30.0–36.0)
MCV: 95.1 fL (ref 78.0–100.0)
MONOS PCT: 9 %
Monocytes Absolute: 1.2 10*3/uL — ABNORMAL HIGH (ref 0.1–1.0)
NEUTROS ABS: 7.1 10*3/uL (ref 1.7–7.7)
Neutrophils Relative %: 56 %
Platelets: ADEQUATE 10*3/uL (ref 150–400)
RBC: 4.47 MIL/uL (ref 3.87–5.11)
RDW: 14.3 % (ref 11.5–15.5)
WBC: 12.8 10*3/uL — ABNORMAL HIGH (ref 4.0–10.5)

## 2015-10-04 LAB — RAPID HIV SCREEN (HIV 1/2 AB+AG)
HIV 1/2 Antibodies: NONREACTIVE
HIV-1 P24 ANTIGEN - HIV24: NONREACTIVE

## 2015-10-04 LAB — URINE MICROSCOPIC-ADD ON

## 2015-10-04 LAB — I-STAT CHEM 8, ED
BUN: 8 mg/dL (ref 6–20)
CALCIUM ION: 1.23 mmol/L (ref 1.12–1.23)
Chloride: 101 mmol/L (ref 101–111)
Creatinine, Ser: 0.9 mg/dL (ref 0.44–1.00)
GLUCOSE: 92 mg/dL (ref 65–99)
HEMATOCRIT: 48 % — AB (ref 36.0–46.0)
HEMOGLOBIN: 16.3 g/dL — AB (ref 12.0–15.0)
Potassium: 3.5 mmol/L (ref 3.5–5.1)
Sodium: 142 mmol/L (ref 135–145)
TCO2: 28 mmol/L (ref 0–100)

## 2015-10-04 LAB — URINALYSIS, ROUTINE W REFLEX MICROSCOPIC
BILIRUBIN URINE: NEGATIVE
Glucose, UA: NEGATIVE mg/dL
Ketones, ur: NEGATIVE mg/dL
NITRITE: POSITIVE — AB
PROTEIN: NEGATIVE mg/dL
Specific Gravity, Urine: 1.019 (ref 1.005–1.030)
pH: 5.5 (ref 5.0–8.0)

## 2015-10-04 LAB — WET PREP, GENITAL
Sperm: NONE SEEN
TRICH WET PREP: NONE SEEN

## 2015-10-04 LAB — PREGNANCY, URINE: Preg Test, Ur: NEGATIVE

## 2015-10-04 LAB — I-STAT CG4 LACTIC ACID, ED: LACTIC ACID, VENOUS: 1.35 mmol/L (ref 0.5–2.0)

## 2015-10-04 LAB — RPR: RPR Ser Ql: NONREACTIVE

## 2015-10-04 MED ORDER — METRONIDAZOLE 500 MG PO TABS: 500.0000 mg | ORAL_TABLET | Freq: Two times a day (BID) | ORAL | Status: DC

## 2015-10-04 MED ORDER — CLINDAMYCIN PHOSPHATE 600 MG/50ML IV SOLN
600.0000 mg | Freq: Once | INTRAVENOUS | Status: AC
  Administered 2015-10-04: 600 mg via INTRAVENOUS
  Filled 2015-10-04: qty 50

## 2015-10-04 MED ORDER — FLUCONAZOLE 150 MG PO TABS
150.0000 mg | ORAL_TABLET | Freq: Once | ORAL | Status: AC
  Administered 2015-10-04: 150 mg via ORAL
  Filled 2015-10-04: qty 1

## 2015-10-04 MED ORDER — AMOXICILLIN-POT CLAVULANATE 875-125 MG PO TABS: 1.0000 | ORAL_TABLET | Freq: Two times a day (BID) | ORAL | Status: DC

## 2015-10-04 MED ORDER — IOPAMIDOL (ISOVUE-300) INJECTION 61%
100.0000 mL | Freq: Once | INTRAVENOUS | Status: AC | PRN
  Administered 2015-10-04: 100 mL via INTRAVENOUS

## 2015-10-04 NOTE — Discharge Instructions (Signed)
You have a periurethral abscess.  Please take antibiotics as prescribed.  Follow up closely with [facility] this week for further care.  Return to the ER if your condition worsen.  Abscess An abscess is an infected area that contains a collection of pus and debris.It can occur in almost any part of the body. An abscess is also known as a furuncle or boil. CAUSES  An abscess occurs when tissue gets infected. This can occur from blockage of oil or sweat glands, infection of hair follicles, or a minor injury to the skin. As the body tries to fight the infection, pus collects in the area and creates pressure under the skin. This pressure causes pain. People with weakened immune systems have difficulty fighting infections and get certain abscesses more often.  SYMPTOMS Usually an abscess develops on the skin and becomes a painful mass that is red, warm, and tender. If the abscess forms under the skin, you may feel a moveable soft area under the skin. Some abscesses break open (rupture) on their own, but most will continue to get worse without care. The infection can spread deeper into the body and eventually into the bloodstream, causing you to feel ill.  DIAGNOSIS  Your caregiver will take your medical history and perform a physical exam. A sample of fluid may also be taken from the abscess to determine what is causing your infection. TREATMENT  Your caregiver may prescribe antibiotic medicines to fight the infection. However, taking antibiotics alone usually does not cure an abscess. Your caregiver may need to make a small cut (incision) in the abscess to drain the pus. In some cases, gauze is packed into the abscess to reduce pain and to continue draining the area. HOME CARE INSTRUCTIONS   Only take over-the-counter or prescription medicines for pain, discomfort, or fever as directed by your caregiver.  If you were prescribed antibiotics, take them as directed. Finish them even if you start to  feel better.  If gauze is used, follow your caregiver's directions for changing the gauze.  To avoid spreading the infection:  Keep your draining abscess covered with a bandage.  Wash your hands well.  Do not share personal care items, towels, or whirlpools with others.  Avoid skin contact with others.  Keep your skin and clothes clean around the abscess.  Keep all follow-up appointments as directed by your caregiver. SEEK MEDICAL CARE IF:   You have increased pain, swelling, redness, fluid drainage, or bleeding.  You have muscle aches, chills, or a general ill feeling.  You have a fever. MAKE SURE YOU:   Understand these instructions.  Will watch your condition.  Will get help right away if you are not doing well or get worse.   This information is not intended to replace advice given to you by your health care provider. Make sure you discuss any questions you have with your health care provider.   Document Released: 02/21/2005 Document Revised: 11/13/2011 Document Reviewed: 07/27/2011 Elsevier Interactive Patient Education 2016 Elsevier Inc.  Bacterial Vaginosis Bacterial vaginosis is an infection of the vagina. It happens when too many germs (bacteria) grow in the vagina. Having this infection puts you at risk for getting other infections from sex. Treating this infection can help lower your risk for other infections, such as:   Chlamydia.  Gonorrhea.  HIV.  Herpes. HOME CARE  Take your medicine as told by your doctor.  Finish your medicine even if you start to feel better.  Tell your  sex partner that you have an infection. They should see their doctor for treatment.  During treatment:  Avoid sex or use condoms correctly.  Do not douche.  Do not drink alcohol unless your doctor tells you it is ok.  Do not breastfeed unless your doctor tells you it is ok. GET HELP IF:  You are not getting better after 3 days of treatment.  You have more grey  fluid (discharge) coming from your vagina than before.  You have more pain than before.  You have a fever. MAKE SURE YOU:   Understand these instructions.  Will watch your condition.  Will get help right away if you are not doing well or get worse.   This information is not intended to replace advice given to you by your health care provider. Make sure you discuss any questions you have with your health care provider.   Document Released: 02/21/2008 Document Revised: 06/04/2014 Document Reviewed: 12/24/2012 Elsevier Interactive Patient Education [employer] Elsevier Inc.

## 2015-10-04 NOTE — ED Notes (Signed)
Patient transported to CT 

## 2015-10-04 NOTE — ED Provider Notes (Signed)
CSN: [id number]     Arrival date & time 10/03/15  2123 History   First MD Initiated Contact with Patient 10/04/15 [phone]     Chief Complaint  Patient presents with  . Groin Swelling     (Consider location/radiation/quality/duration/timing/severity/associated sxs/prior Treatment) HPI  50 year old female presents with complaints of vaginal discomfort. Patient states having gradual onset of swelling and discomfort to her vaginal canal for 1 week. She described pain as sharp and stabbing sensation worsening with palpation. The past 2 days has gotten progressively worse. No specific treatment tried at home. While waiting the ED, patient states the nodule has bursted and now she feels better.  She did notice some foul smelling discharge after it bursted. She denies fever, abd pain,Back pain dysuria, vaginal bleeding, or recent trauma.  Unable to recall last tetanus status.  LMP 5/1.  Patient report been sexually active with one partner using protection on occasion. No prior history of STD.  History reviewed. No pertinent past medical history. Past Surgical History  Procedure Laterality Date  . Cholecystectomy N/A 10/18/2014    Procedure: LAPAROSCOPIC CHOLECYSTECTOMY WITH INTRAOPERATIVE CHOLANGIOGRAM;  Surgeon: Claud Kelp, MD;  Location: [facility];  Service: General;  Laterality: N/A;   History reviewed. No pertinent family history. Social History  Substance Use Topics  . Smoking status: Current Every Day Smoker    Types: Cigarettes  . Smokeless tobacco: None  . Alcohol Use: Yes   OB History    No data available     Review of Systems  Constitutional: Negative for fever.  Gastrointestinal: Negative for abdominal pain.  Genitourinary: Positive for vaginal pain. Negative for flank pain.  Musculoskeletal: Negative for back pain.  Skin: Negative for rash and wound.  All other systems reviewed and are negative.     Allergies  Review of patient's allergies indicates no known  allergies.  Home Medications   Prior to Admission medications   Medication Sig Start Date End Date Taking? Authorizing Provider  docusate sodium (COLACE) 100 MG capsule Take 2 capsules (200 mg total) by mouth 2 (two) times daily as needed for mild constipation. Patient not taking: Reported on 10/03/2015 10/19/14   Leroy Sea, MD  HYDROcodone-acetaminophen (NORCO/VICODIN) 5-325 MG per tablet Take 1 tablet by mouth every 6 (six) hours as needed for moderate pain or severe pain. Patient not taking: Reported on 10/03/2015 10/19/14   Leroy Sea, MD  promethazine (PHENERGAN) 12.5 MG tablet Take 1 tablet (12.5 mg total) by mouth every 6 (six) hours as needed for nausea or vomiting. Patient not taking: Reported on 10/03/2015 10/19/14   Leroy Sea, MD   BP 133/79 mmHg  Pulse 88  Temp(Src) 98.6 F (37 C) (Oral)  Resp 16  SpO2 99%  LMP 09/26/2015 (Approximate) Physical Exam  Constitutional: She appears well-developed and well-nourished. No distress.  HENT:  Head: Atraumatic.  Eyes: Conjunctivae are normal.  Neck: Neck supple.  Cardiovascular: Normal rate and regular rhythm.   Pulmonary/Chest: Effort normal and breath sounds normal.  Abdominal: Soft. There is no tenderness.  Genitourinary:  Chaperone present during exam. No inguinal lymphadenopathy or inguinal hernia noted. External genitalia is remarkable for a 4 cm visible swelling noted at the urethral opening involving the superior aspects of the vaginal canal with frank pus actively oozing from the urethra with strong foul odor and tenderness to palpation. Normal labia minora and labia majora. Discomfort with speculum insertion. Close cervical os visualized with minimal discharge from the os. On bimanual examination no adnexal  tenderness or cervical motion tenderness however exam is difficult due to discomfort of the vaginal canal.  Neurological: She is alert.  Skin: No rash noted.  Psychiatric: She has a normal mood and affect.   Nursing note and vitals reviewed.   ED Course  Procedures (including critical care time) Labs Review Labs Reviewed  WET PREP, GENITAL - Abnormal; Notable for the following:    Yeast Wet Prep HPF POC PRESENT (*)    Clue Cells Wet Prep HPF POC PRESENT (*)    WBC, Wet Prep HPF POC FEW (*)    All other components within normal limits  CBC WITH DIFFERENTIAL/PLATELET - Abnormal; Notable for the following:    WBC 12.8 (*)    Monocytes Absolute 1.2 (*)    Eosinophils Absolute 1.4 (*)    All other components within normal limits  URINALYSIS, ROUTINE W REFLEX MICROSCOPIC (NOT AT [facility]) - Abnormal; Notable for the following:    APPearance CLOUDY (*)    Hgb urine dipstick SMALL (*)    Nitrite POSITIVE (*)    Leukocytes, UA LARGE (*)    All other components within normal limits  URINE MICROSCOPIC-ADD ON - Abnormal; Notable for the following:    Squamous Epithelial / LPF 0-5 (*)    Bacteria, UA FEW (*)    All other components within normal limits  I-STAT CHEM 8, ED - Abnormal; Notable for the following:    Hemoglobin 16.3 (*)    HCT 48.0 (*)    All other components within normal limits  CULTURE, BLOOD (ROUTINE X 2)  CULTURE, BLOOD (ROUTINE X 2)  RAPID HIV SCREEN (HIV 1/2 AB+AG)  PREGNANCY, URINE  RPR  POC URINE PREG, ED  I-STAT CG4 LACTIC ACID, ED  GC/CHLAMYDIA PROBE AMP ([place]) NOT AT [facility]    Imaging Review Ct Abdomen Pelvis W Contrast  10/04/2015  CLINICAL DATA:  Abscess at urethral opening. EXAM: CT ABDOMEN AND PELVIS WITH CONTRAST TECHNIQUE: Multidetector CT imaging of the abdomen and pelvis was performed using the standard protocol following bolus administration of intravenous contrast. CONTRAST:  100mL ISOVUE-300 IOPAMIDOL (ISOVUE-300) INJECTION 61% COMPARISON:  None. FINDINGS: Lower chest and abdominal wall:  No contributory findings. Hepatobiliary: No focal liver abnormality.Cholecystectomy with normal common bile duct diameter. Pancreas: Unremarkable. Spleen: Unremarkable.  Adrenals/Urinary Tract: Bilateral low-density adrenal masses, 19 mm on the right and 22 mm on the left. These are stable from 10/17/2014 MRI and attributed to adenomas. No hydronephrosis or stone. Unremarkable bladder. Reproductive: 17 x 6 mm ovoid rim enhancing fluid collection that is periurethral, below the level of the symphysis pubis. This is left of midline and anteriorly contacts the ventral/lateral wall of the vagina. Uterine fibroids, including 10 cm right-sided fibroid which obscures the endometrial canal that is displaced left cord. Stomach/Bowel: No obstruction. No appendicitis. Mild colonic diverticulosis. Vascular/Lymphatic: No acute vascular abnormality. No mass or adenopathy. Peritoneal: No ascites or pneumoperitoneum. Musculoskeletal: Sacroiliitis with sclerosis and subchondral irregularity there is superimposed degeneration with spurring. Osteitis pubis. No syndesmophytes. IMPRESSION: 1. 17 x 6 mm left periurethral fluid collection, clinically reported as abscess. 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 Pelvic MRI with contrast could help classify, if clinically needed. 2. Bilateral adrenal adenomas. 3. Uterine fibroids, including 10 cm mass displacing the endometrium. 4. Chronic sacroiliitis. Electronically Signed   By: Marnee SpringJonathon  Watts M.D.   On: 10/04/2015 09:27   I have personally reviewed and evaluated these images and lab results as part of my medical  decision-making.   EKG Interpretation None      MDM   Final diagnoses:  Abscess of urethra and/or periurethral region  BV (bacterial vaginosis)   BP 128/84 mmHg  Pulse 69  Temp(Src) 98.6 F (37 C) (Oral)  Resp 15  SpO2 98%  LMP 09/26/2015 (Approximate)   6:39 AM Patient presents with complaints of pain and swelling in her vaginal canal. It appears she has a large abscess involving the urethral opening and the superior aspects of the vaginal canal concerning for potential  fistula. She will need a CT scan for further evaluation as well as antibiotic. She is afebrile with stable normal vital sign. Care discussed with Dr. Clarene Duke.  Anticipate consultation with OBGYN.  STD panel obtained.    10:48 AM Urine shows signs of urinary tract infection infection with nitrite positive and large leukocyte esterase. This is likely a contaminant from the abscess. Mildly elevated WBC of 12.8. Wet prep shows evidence of yeast, as well as clue cells. Abdominal pelvic CT scan demonstrated a 17 x 6 millimeter left periurethral fluid collection suggesting an underlying Skene's or Cartner duct cysts over urethral diverticulum. Evidence of uterine fibroid as well. Evidence of chronic sacroiliitis. I have consult on-call OB/GYN, Dr. Dan Humphreys who suggest a urology consult for outpt f/u.  She also recommend treating pt with levofloxacin or bactrim.  Otherwise she felt no further management needed from a GYN standpoint.  i will consult our urologist.    11:50 AM Appreciate consultation from urology DR. Hedrick who agrees pt should be seen outpt.  Office will call her for f/u sometimes this week.  Pt agrees with plan.  Stable for discharge with augmentin per recommendation from urologist.    Fayrene Helper, PA-C 10/04/15 1218  Laurence Spates, MD 10/06/15 0001

## 2015-10-09 LAB — CULTURE, BLOOD (ROUTINE X 2)
CULTURE: NO GROWTH
Culture: NO GROWTH

## 2015-10-28 ENCOUNTER — Encounter (HOSPITAL_COMMUNITY): Payer: Self-pay | Admitting: Emergency Medicine

## 2015-10-28 ENCOUNTER — Emergency Department (HOSPITAL_COMMUNITY)
Admission: EM | Admit: 2015-10-28 | Discharge: 2015-10-28 | Disposition: A | Discharge status: Home / Self Care | Payer: BLUE CROSS/BLUE SHIELD | Attending: Emergency Medicine | Admitting: Emergency Medicine

## 2015-10-28 DIAGNOSIS — F17210 Nicotine dependence, cigarettes, uncomplicated: Secondary | ICD-10-CM | POA: Insufficient documentation

## 2015-10-28 DIAGNOSIS — K922 Gastrointestinal hemorrhage, unspecified: Secondary | ICD-10-CM

## 2015-10-28 DIAGNOSIS — Z79899 Other long term (current) drug therapy: Secondary | ICD-10-CM | POA: Insufficient documentation

## 2015-10-28 LAB — COMPREHENSIVE METABOLIC PANEL
ALT: 12 U/L — ABNORMAL LOW (ref 14–54)
AST: 18 U/L (ref 15–41)
Albumin: 3.8 g/dL (ref 3.5–5.0)
Alkaline Phosphatase: 73 U/L (ref 38–126)
Anion gap: 8 (ref 5–15)
BUN: 11 mg/dL (ref 6–20)
CHLORIDE: 108 mmol/L (ref 101–111)
CO2: 23 mmol/L (ref 22–32)
Calcium: 9.3 mg/dL (ref 8.9–10.3)
Creatinine, Ser: 1.15 mg/dL — ABNORMAL HIGH (ref 0.44–1.00)
GFR, EST NON AFRICAN AMERICAN: 56 mL/min — AB (ref >60)
Glucose, Bld: 118 mg/dL — ABNORMAL HIGH (ref 65–99)
POTASSIUM: 3.7 mmol/L (ref 3.5–5.1)
Sodium: 139 mmol/L (ref 135–145)
Total Bilirubin: 0.1 mg/dL — ABNORMAL LOW (ref 0.3–1.2)
Total Protein: 7.4 g/dL (ref 6.5–8.1)

## 2015-10-28 LAB — CBC
HEMATOCRIT: 38.5 % (ref 36.0–46.0)
Hemoglobin: 13.1 g/dL (ref 12.0–15.0)
MCH: 31.3 pg (ref 26.0–34.0)
MCHC: 34 g/dL (ref 30.0–36.0)
MCV: 92.1 fL (ref 78.0–100.0)
Platelets: 216 10*3/uL (ref 150–400)
RBC: 4.18 MIL/uL (ref 3.87–5.11)
RDW: 13.8 % (ref 11.5–15.5)
WBC: 8.5 10*3/uL (ref 4.0–10.5)

## 2015-10-28 LAB — TYPE AND SCREEN
ABO/RH(D): O POS
Antibody Screen: NEGATIVE

## 2015-10-28 LAB — POC OCCULT BLOOD, ED: FECAL OCCULT BLD: POSITIVE — AB

## 2015-10-28 LAB — ABO/RH: ABO/RH(D): O POS

## 2015-10-28 NOTE — ED Notes (Signed)
Patient states that last night she had little dark blood in her stool and then this morning had lot dark blood in stool.  Patient c/o RLQ pain.

## 2015-10-28 NOTE — Progress Notes (Deleted)
CM spoke with pt who confirms uninsured [place] resident with no pcp.  CM discussed and provided written information to assist pt with determining choice for uninsured accepting pcps, discussed the importance of pcp vs EDP services for f/u care, www.needymeds.org, www.goodrx.com, discounted pharmacies and other [place] resources such as [facility] , [facility], affordable care act, financial assistance, uninsured dental services, [place] med assist, DSS and  health department  Reviewed resources for [place] uninsured accepting pcps like [facility], [facility] [address], [facility], [facility], local urgent care centers, [facility], [facility], general medical clinics, [facility], [facility] plus others, medication resources, [facility] out patient pharmacies and housing Pt voiced understanding and appreciation of resources provided   Provided [facility] contact information 

## 2015-10-28 NOTE — Discharge Instructions (Signed)
Gastrointestinal Bleeding °Gastrointestinal bleeding is bleeding somewhere along the path that food travels through the body (digestive tract). This path is anywhere between the mouth and the opening of the butt (anus). You may have blood in your throw up (vomit) or in your poop (stools). If there is a lot of bleeding, you may need to stay in the hospital. °HOME CARE °· Only take medicine as told by your doctor. °· Eat foods with fiber such as whole grains, fruits, and vegetables. You can also try eating 1 to 3 prunes a day. °· Drink enough fluids to keep your pee (urine) clear or pale yellow. °GET HELP RIGHT AWAY IF:  °· Your bleeding gets worse. °· You feel dizzy, weak, or you pass out (faint). °· You have bad cramps in your back or belly (abdomen). °· You have large blood clumps (clots) in your poop. °· Your problems are getting worse. °MAKE SURE YOU:  °· Understand these instructions. °· Will watch your condition. °· Will get help right away if you are not doing well or get worse. °  °This information is not intended to replace advice given to you by your health care provider. Make sure you discuss any questions you have with your health care provider. °  °Document Released: 02/21/2008 Document Revised: 04/30/2012 Document Reviewed: 11/01/2014 °Elsevier Interactive Patient Education ©2016 Elsevier Inc. ° °

## 2015-10-28 NOTE — ED Provider Notes (Addendum)
CSN: [id number]     Arrival date & time 10/28/15  1403 History   First MD Initiated Contact with Patient 10/28/15 1425     Chief Complaint  Patient presents with  . Blood In Stools     (Consider location/radiation/quality/duration/timing/severity/associated sxs/prior Treatment) Patient is a 50 y.o. female presenting with hematochezia. The history is provided by the patient.  Rectal Bleeding Quality: dark blood with clot. Amount:  Copious Duration: one episode last night and one this morning. Timing:  Sporadic Progression:  Worsening Chronicity:  New Context: spontaneously   Context: not hemorrhoids and not rectal pain   Context comment:  Patient was hospitalized last week for a pelvic infection and states initially had some constipation but that had resolved Similar prior episodes: no   Relieved by:  None tried Worsened by:  Nothing tried Ineffective treatments:  None tried Associated symptoms: abdominal pain and recent illness   Associated symptoms: no fever, no hematemesis, no light-headedness, no loss of consciousness and no vomiting   Associated symptoms comment:  Mild bilateral abdominal tenderness Risk factors: no anticoagulant use, no hx of colorectal cancer, no liver disease and no NSAID use     History reviewed. No pertinent past medical history. Past Surgical History  Procedure Laterality Date  . Cholecystectomy N/A 10/18/2014    Procedure: LAPAROSCOPIC CHOLECYSTECTOMY WITH INTRAOPERATIVE CHOLANGIOGRAM;  Surgeon: Claud KelpHaywood Ingram, MD;  Location: [facility];  Service: General;  Laterality: N/A;   No family history on file. Social History  Substance Use Topics  . Smoking status: Current Every Day Smoker    Types: Cigarettes  . Smokeless tobacco: None  . Alcohol Use: Yes   OB History    No data available     Review of Systems  Constitutional: Negative for fever.  Gastrointestinal: Positive for abdominal pain and hematochezia. Negative for vomiting and hematemesis.   Neurological: Negative for loss of consciousness and light-headedness.  All other systems reviewed and are negative.     Allergies  Review of patient's allergies indicates no known allergies.  Home Medications   Prior to Admission medications   Medication Sig Start Date End Date Taking? Authorizing Provider  amoxicillin-clavulanate (AUGMENTIN) 875-125 MG tablet Take 1 tablet by mouth 2 (two) times daily. One po bid x 7 days 10/04/15   Fayrene HelperBowie Tran, PA-C  docusate sodium (COLACE) 100 MG capsule Take 2 capsules (200 mg total) by mouth 2 (two) times daily as needed for mild constipation. Patient not taking: Reported on 10/03/2015 10/19/14   Leroy SeaPrashant K Singh, MD  HYDROcodone-acetaminophen (NORCO/VICODIN) 5-325 MG per tablet Take 1 tablet by mouth every 6 (six) hours as needed for moderate pain or severe pain. Patient not taking: Reported on 10/03/2015 10/19/14   Leroy SeaPrashant K Singh, MD  metroNIDAZOLE (FLAGYL) 500 MG tablet Take 1 tablet (500 mg total) by mouth 2 (two) times daily. 10/04/15   Fayrene HelperBowie Tran, PA-C  promethazine (PHENERGAN) 12.5 MG tablet Take 1 tablet (12.5 mg total) by mouth every 6 (six) hours as needed for nausea or vomiting. Patient not taking: Reported on 10/03/2015 10/19/14   Leroy SeaPrashant K Singh, MD   BP 131/89 mmHg  Pulse 101  Temp(Src) 99 F (37.2 C) (Oral)  Resp 16  SpO2 100%  LMP 10/25/2015 Physical Exam  Constitutional: She is oriented to person, place, and time. She appears well-developed and well-nourished. No distress.  obese  HENT:  Head: Normocephalic and atraumatic.  Mouth/Throat: Oropharynx is clear and moist.  Eyes: Conjunctivae and EOM are normal. Pupils are equal,  round, and reactive to light.  Neck: Normal range of motion. Neck supple.  Cardiovascular: Normal rate, regular rhythm and intact distal pulses.   No murmur heard. Pulmonary/Chest: Effort normal and breath sounds normal. No respiratory distress. She has no wheezes. She has no rales.  Abdominal: Soft. She  exhibits no distension. There is no tenderness. There is no rebound and no guarding.  No reproducible tenderness  Genitourinary: Rectal exam shows no external hemorrhoid, no mass and no tenderness. Guaiac positive stool.  Musculoskeletal: Normal range of motion. She exhibits no edema or tenderness.  Neurological: She is alert and oriented to person, place, and time.  Skin: Skin is warm and dry. No rash noted. No erythema.  Psychiatric: She has a normal mood and affect. Her behavior is normal.  Nursing note and vitals reviewed.   ED Course  Procedures (including critical care time) Labs Review Labs Reviewed  COMPREHENSIVE METABOLIC PANEL - Abnormal; Notable for the following:    Glucose, Bld 118 (*)    Creatinine, Ser 1.15 (*)    ALT 12 (*)    Total Bilirubin 0.1 (*)    GFR calc non Af Amer 56 (*)    All other components within normal limits  POC OCCULT BLOOD, ED - Abnormal; Notable for the following:    Fecal Occult Bld POSITIVE (*)    All other components within normal limits  CBC  TYPE AND SCREEN  ABO/RH    Imaging Review No results found. I have personally reviewed and evaluated these images and lab results as part of my medical decision-making.   EKG Interpretation None      MDM   Final diagnoses:  Acute lower GI bleeding    Patient is a 50 year old female presenting today with 2 episodes of bloody stool that started yesterday. She states the blood is dark and clotted but stool appears normal in color. It is nonpainful to have a bowel movement. No prior history of the same. Recently hospitalized with some constipation but that has resolved. No prior colonoscopies or rectal bleeding. Mild tenderness of the abdomen on exam it's nonspecific. Rectal exam with bright red blood without evidence of external hemorrhoids.  Patient denies any nausea, vomiting and has been able to eat without consequence. CBC, CMP, Hemoccult pending.  4:59 PM  Patient's hemoglobin is  stable at 13. LFTs are significantly improved from prior studies. Platelets within normal limits. Patient denies any NSAID use, heavy alcohol use or other concerns. She is currently hemodynamically stable. Spoke with Dr. Dulce Sellar with GI feel patient is appropriate for outpatient follow-up. She was given strict return precautions if she starts having numerous bloody stools. Feel most likely patient's bleeding is coming from internal hemorrhoids versus diverticulosis.     Gwyneth Sprout, MD 10/28/15 1700  Gwyneth Sprout, MD 10/28/15 1704

## 2015-10-28 NOTE — Progress Notes (Addendum)
CM spoke with pt who confirms uninsured [place] resident with no pcp.  CM discussed and provided written information to assist pt with determining choice for uninsured accepting pcps, discussed the importance of pcp vs EDP services for f/u care, www.needymeds.org, www.goodrx.com, discounted pharmacies and other [place] resources such as [facility] , [facility], affordable care act, financial assistance, uninsured dental services, [place] med assist, DSS and  health department  Reviewed resources for [place] uninsured accepting pcps like [facility], [facility] [address], [facility], [facility], local urgent care centers, [facility], [facility], general medical clinics, [facility], [facility] plus others, medication resources, [facility] out patient [facility]ies and housing Pt voiced understanding and appreciation of resources provided   Provided [facility] contact information Pt agreed to a referral Cm completed referral Pt to be contact by [facility] clinical liason  Entered in d/c instructions Please use the resources provided to you in emergency room by case manager to assist you're your choice of doctor for follow up A referral for you has been sent to [facility] if you have not received a call in 3 days you may contact them Call Karen Andrianos at [phone] Tuesday-Friday [email or web address] These [place] uninsured resources provide possible primary care providers, resources for discounted medications, housing, dental resources, affordable care act information, plus other resources for [place]   

## 2016-10-05 ENCOUNTER — Encounter (HOSPITAL_COMMUNITY): Payer: Self-pay

## 2016-10-05 ENCOUNTER — Emergency Department (HOSPITAL_COMMUNITY): Payer: Self-pay

## 2016-10-05 DIAGNOSIS — F17210 Nicotine dependence, cigarettes, uncomplicated: Secondary | ICD-10-CM | POA: Insufficient documentation

## 2016-10-05 DIAGNOSIS — Z79899 Other long term (current) drug therapy: Secondary | ICD-10-CM | POA: Insufficient documentation

## 2016-10-05 DIAGNOSIS — K5732 Diverticulitis of large intestine without perforation or abscess without bleeding: Secondary | ICD-10-CM | POA: Insufficient documentation

## 2016-10-05 NOTE — ED Triage Notes (Signed)
Pt c/o LLQ abdominal pain, centralized chest and epigastric pain, and L flank pain. Denies urinary symptoms, denies diarrhea. Pt endorses vomiting. She states that these symptoms started yesterday, but worsened today. A&Ox4. Ambulatory.

## 2016-10-06 ENCOUNTER — Emergency Department (HOSPITAL_COMMUNITY)
Admission: EM | Admit: 2016-10-06 | Discharge: 2016-10-06 | Disposition: A | Discharge status: Home / Self Care | Payer: Self-pay | Attending: Emergency Medicine | Admitting: Emergency Medicine

## 2016-10-06 ENCOUNTER — Emergency Department (HOSPITAL_COMMUNITY): Payer: Self-pay

## 2016-10-06 ENCOUNTER — Encounter (HOSPITAL_COMMUNITY): Payer: Self-pay | Admitting: Radiology

## 2016-10-06 DIAGNOSIS — K5792 Diverticulitis of intestine, part unspecified, without perforation or abscess without bleeding: Secondary | ICD-10-CM

## 2016-10-06 LAB — COMPREHENSIVE METABOLIC PANEL WITH GFR
ALT: 30 U/L (ref 14–54)
AST: 32 U/L (ref 15–41)
Albumin: 4.3 g/dL (ref 3.5–5.0)
Alkaline Phosphatase: 104 U/L (ref 38–126)
Anion gap: 9 (ref 5–15)
BUN: 9 mg/dL (ref 6–20)
CO2: 26 mmol/L (ref 22–32)
Calcium: 9.6 mg/dL (ref 8.9–10.3)
Chloride: 101 mmol/L (ref 101–111)
Creatinine, Ser: 0.94 mg/dL (ref 0.44–1.00)
GFR calc Af Amer: >60 mL/min
GFR calc non Af Amer: >60 mL/min
Glucose, Bld: 113 mg/dL — ABNORMAL HIGH (ref 65–99)
Potassium: 3.6 mmol/L (ref 3.5–5.1)
Sodium: 136 mmol/L (ref 135–145)
Total Bilirubin: 0.8 mg/dL (ref 0.3–1.2)
Total Protein: 8.4 g/dL — ABNORMAL HIGH (ref 6.5–8.1)

## 2016-10-06 LAB — CBC
HEMATOCRIT: 38.5 % (ref 36.0–46.0)
HEMOGLOBIN: 13.1 g/dL (ref 12.0–15.0)
MCH: 31.3 pg (ref 26.0–34.0)
MCHC: 34 g/dL (ref 30.0–36.0)
MCV: 91.9 fL (ref 78.0–100.0)
Platelets: 227 10*3/uL (ref 150–400)
RBC: 4.19 MIL/uL (ref 3.87–5.11)
RDW: 14.6 % (ref 11.5–15.5)
WBC: 16.1 10*3/uL — ABNORMAL HIGH (ref 4.0–10.5)

## 2016-10-06 LAB — URINALYSIS, ROUTINE W REFLEX MICROSCOPIC
Bilirubin Urine: NEGATIVE
Glucose, UA: NEGATIVE mg/dL
Hgb urine dipstick: NEGATIVE
Ketones, ur: 5 mg/dL — AB
Leukocytes, UA: NEGATIVE
Nitrite: NEGATIVE
Protein, ur: 30 mg/dL — AB
Specific Gravity, Urine: 1.029 (ref 1.005–1.030)
pH: 5 (ref 5.0–8.0)

## 2016-10-06 LAB — LIPASE, BLOOD: Lipase: 15 U/L (ref 11–51)

## 2016-10-06 LAB — I-STAT TROPONIN, ED: Troponin i, poc: 0 ng/mL (ref 0.00–0.08)

## 2016-10-06 LAB — I-STAT BETA HCG BLOOD, ED (MC, WL, AP ONLY): I-stat hCG, quantitative: <5 m[IU]/mL (ref <5)

## 2016-10-06 MED ORDER — METRONIDAZOLE 500 MG PO TABS
500.0000 mg | ORAL_TABLET | Freq: Once | ORAL | Status: AC
  Administered 2016-10-06: 500 mg via ORAL
  Filled 2016-10-06: qty 1

## 2016-10-06 MED ORDER — TRAMADOL HCL 50 MG PO TABS: 50.0000 mg | ORAL_TABLET | Freq: Four times a day (QID) | ORAL | 0 refills | Status: DC | PRN

## 2016-10-06 MED ORDER — IOPAMIDOL (ISOVUE-300) INJECTION 61%
100.0000 mL | Freq: Once | INTRAVENOUS | Status: AC | PRN
  Administered 2016-10-06: 100 mL via INTRAVENOUS

## 2016-10-06 MED ORDER — IOPAMIDOL (ISOVUE-300) INJECTION 61%
INTRAVENOUS | Status: AC
  Filled 2016-10-06: qty 100

## 2016-10-06 MED ORDER — CIPROFLOXACIN HCL 500 MG PO TABS
500.0000 mg | ORAL_TABLET | Freq: Once | ORAL | Status: AC
  Administered 2016-10-06: 500 mg via ORAL
  Filled 2016-10-06: qty 1

## 2016-10-06 MED ORDER — CIPROFLOXACIN HCL 500 MG PO TABS: 500.0000 mg | ORAL_TABLET | Freq: Two times a day (BID) | ORAL | 0 refills | Status: DC

## 2016-10-06 MED ORDER — METRONIDAZOLE 500 MG PO TABS: 500.0000 mg | ORAL_TABLET | Freq: Three times a day (TID) | ORAL | 0 refills | Status: DC

## 2016-10-06 NOTE — ED Provider Notes (Signed)
[facility]-EMER[facility]ENCY [facility]PT Provider Note   CSN: [id number] Arrival date & time: 10/05/16  2231   By signing my name below, I, Clarisse Gouge, attest that this documentation has been prepared under the direction and in the presence of Gilda Crease, MD. Electronically Signed: Clarisse Gouge, Scribe. 10/06/16. 1:46 AM.   History   Chief Complaint Chief Complaint  Patient presents with  . Abdominal Pain  . Chest Pain   The history is provided by the patient and medical records. No language interpreter was used.    Nicole Ross is a 51 y.o. female with h/o Cholecystectomy who presents to the Emergency Department with concern for LUQ x 2-3 days. Associated central chest pain radiating around the L side into the L mid back and N/V noted. She describes constant 7/10, sore, cramping pain. No modifying factors noted.  Pancreas and gallbladder both intact without h/o disorders to either. She states she drinks alcohol ~every other day. No diarrhea, constipation, bloody stool, urinary frequency or dysuria noted. No other complaints at this time.  History reviewed. No pertinent past medical history.  Patient Active Problem List   Diagnosis Date Noted  . Choledocholithiasis with acute cholecystitis 10/17/2014  . Abdominal pain, acute 10/17/2014    Past Surgical History:  Procedure Laterality Date  . CHOLECYSTECTOMY N/A 10/18/2014   Procedure: LAPAROSCOPIC CHOLECYSTECTOMY WITH INTRAOPERATIVE CHOLANGIOGRAM;  Surgeon: Claud Kelp, MD;  Location: [facility];  Service: General;  Laterality: N/A;    OB History    No data available       Home Medications    Prior to Admission medications   Medication Sig Start Date End Date Taking? Authorizing Provider  acetaminophen (TYLENOL) 500 MG tablet Take 1,000 mg by mouth every 6 (six) hours as needed for mild pain or moderate pain.   Yes [provider]  ciprofloxacin (CIPRO) 500 MG tablet Take 1 tablet (500 mg total) by mouth 2 (two) times  daily. 10/06/16   Gilda Crease, MD  metroNIDAZOLE (FLAGYL) 500 MG tablet Take 1 tablet (500 mg total) by mouth 3 (three) times daily. 10/06/16   Gilda Crease, MD  promethazine (PHENERGAN) 12.5 MG tablet Take 1 tablet (12.5 mg total) by mouth every 6 (six) hours as needed for nausea or vomiting. Patient not taking: Reported on 10/03/2015 10/19/14   Leroy Sea, MD  traMADol (ULTRAM) 50 MG tablet Take 1 tablet (50 mg total) by mouth every 6 (six) hours as needed. 10/06/16   Gilda Crease, MD    Family History History reviewed. No pertinent family history.  Social History Social History  Substance Use Topics  . Smoking status: Current Every Day Smoker    Types: Cigarettes  . Smokeless tobacco: Not on file  . Alcohol use Yes     Allergies   Patient has no known allergies.   Review of Systems Review of Systems All other systems reviewed and all systems are negative for acute changes except as noted in the HPI and PMH.    Physical Exam Updated Vital Signs BP 124/79 (BP Location: Right Arm)   Pulse 90   Temp 98.6 F (37 C) (Oral)   Resp 20   LMP 10/02/2016 (Approximate)   SpO2 100%   Physical Exam  Constitutional: She is oriented to person, place, and time. She appears well-developed and well-nourished. No distress.  HENT:  Head: Normocephalic and atraumatic.  Right Ear: Hearing normal.  Left Ear: Hearing normal.  Nose: Nose normal.  Mouth/Throat: Oropharynx is  clear and moist and mucous membranes are normal.  Eyes: Conjunctivae and EOM are normal. Pupils are equal, round, and reactive to light.  Neck: Normal range of motion. Neck supple.  Cardiovascular: Regular rhythm, S1 normal and S2 normal.  Tachycardia present.  Exam reveals no gallop and no friction rub.   No murmur heard. Pulmonary/Chest: Effort normal and breath sounds normal. No respiratory distress. She exhibits no tenderness.  Abdominal: Soft. Normal appearance and bowel sounds are  normal. There is no hepatosplenomegaly. There is tenderness in the left upper quadrant. There is no rebound, no guarding, no tenderness at McBurney's point and negative Murphy's sign. No hernia.  Musculoskeletal: Normal range of motion.  Neurological: She is alert and oriented to person, place, and time. She has normal strength. No cranial nerve deficit or sensory deficit. Coordination normal. GCS eye subscore is 4. GCS verbal subscore is 5. GCS motor subscore is 6.  Skin: Skin is warm, dry and intact. No rash noted. No cyanosis.  Psychiatric: She has a normal mood and affect. Her speech is normal and behavior is normal. Thought content normal.  Nursing note and vitals reviewed.    ED Treatments / Results  DIAGNOSTIC STUDIES: Oxygen Saturation is 100% on RA, NL by my interpretation.    COORDINATION OF CARE: 1:42 AM-Discussed next steps with pt. Pt verbalized understanding and is agreeable with the plan. Will order medication, blood work and labs.   Labs (all labs ordered are listed, but only abnormal results are displayed) Labs Reviewed  COMPREHENSIVE METABOLIC PANEL - Abnormal; Notable for the following:       Result Value   Glucose, Bld 113 (*)    Total Protein 8.4 (*)    All other components within normal limits  CBC - Abnormal; Notable for the following:    WBC 16.1 (*)    All other components within normal limits  URINALYSIS, ROUTINE W REFLEX MICROSCOPIC - Abnormal; Notable for the following:    Color, Urine AMBER (*)    APPearance HAZY (*)    Ketones, ur 5 (*)    Protein, ur 30 (*)    Bacteria, UA RARE (*)    Squamous Epithelial / LPF 0-5 (*)    All other components within normal limits  LIPASE, BLOOD  I-STAT BETA HCG BLOOD, ED ([facility], [facility], [facility] ONLY)  I-STAT TROPOININ, ED    EKG  EKG Interpretation  Date/Time:  Friday Oct 05 2016 22:42:37 EDT Ventricular Rate:  103 PR Interval:    QRS Duration: 90 QT Interval:  314 QTC Calculation: 411 R Axis:   26 Text  Interpretation:  Sinus tachycardia Low voltage, precordial leads Confirmed by Codie Krogh  MD, Garison Genova [phone]) on 10/06/2016 1:40:37 AM       Radiology Dg Chest 2 View  Result Date: 10/05/2016 CLINICAL DATA:  Left lower quadrant abdominal pain, centralized chest and epigastric pain, and left flank pain. Vomiting. Symptoms started yesterday. Smoker. EXAM: CHEST  2 VIEW COMPARISON:  None. FINDINGS: Normal heart size and pulmonary vascularity. No focal airspace disease or consolidation in the lungs. No blunting of costophrenic angles. No pneumothorax. Mediastinal contours appear intact. Mild degenerative changes in the spine. Mild thoracic curvature convex towards the right. IMPRESSION: No active cardiopulmonary disease. Electronically Signed   By: Burman Nieves M.D.   On: 10/05/2016 23:43   Ct Abdomen Pelvis W Contrast  Result Date: 10/06/2016 CLINICAL DATA:  51 y/o F; left lower quadrant abdominal pain, centralized chest and epigastric pain, and left flank pain.  Vomiting. EXAM: CT ABDOMEN AND PELVIS WITH CONTRAST TECHNIQUE: Multidetector CT imaging of the abdomen and pelvis was performed using the standard protocol following bolus administration of intravenous contrast. CONTRAST:  100mL ISOVUE-300 IOPAMIDOL (ISOVUE-300) INJECTION 61% COMPARISON:  02/04/2016 CT of abdomen and pelvis. 10/17/2014 MRI of the abdomen. FINDINGS: Lower chest: No acute abnormality. Hepatobiliary: No focal liver abnormality is seen. Status post cholecystectomy. No biliary dilatation. Pancreas: Unremarkable. No pancreatic ductal dilatation or surrounding inflammatory changes. Spleen: Normal in size without focal abnormality. Adrenals/Urinary Tract: Right adrenal nodule measuring 19 x 16 mm and left adrenal nodule measuring 22 x 22 mm are stable and compatible with adenoma. No focal kidney lesion. No renal stone identified. No hydronephrosis. Normal bladder. Stomach/Bowel: Inflammatory changes surrounding the splenic flexure of  the colon in the setting of diverticulosis compatible with acute diverticulitis. No evidence for perforation or pericolonic abscess at this time. Otherwise no inflammatory or obstructive changes of bowel. Normal appendix. Vascular/Lymphatic: Aortic atherosclerosis. No enlarged abdominal or pelvic lymph nodes. Reproductive: Stable myomatous uterus. No adnexal lesion identified. Other: No abdominal wall hernia or abnormality. No abdominopelvic ascites. Musculoskeletal: No acute fracture. Sclerosis of sacroiliac joints may be degenerative or represent condensing osteitis. IMPRESSION: 1. Short segment of acute diverticulitis at the left splenic flexure. No evidence for perforation or pericolonic abscess. 2. Bilateral stable adrenal adenoma. 3. Stable myomatous uterus. Electronically Signed   By: Mitzi HansenLance  Furusawa-Stratton M.D.   On: 10/06/2016 04:26    Procedures Procedures (including critical care time)  Medications Ordered in ED Medications  iopamidol (ISOVUE-300) 61 % injection (not administered)  ciprofloxacin (CIPRO) tablet 500 mg (not administered)  metroNIDAZOLE (FLAGYL) tablet 500 mg (not administered)  iopamidol (ISOVUE-300) 61 % injection 100 mL (100 mLs Intravenous Contrast Given 10/06/16 0407)     Initial Impression / Assessment and Plan / ED Course  I have reviewed the triage vital signs and the nursing notes.  Pertinent labs & imaging results that were available during my care of the patient were reviewed by me and considered in my medical decision making (see chart for details).     Patient presents to the emergency department for evaluation of abdominal pain radiating up into the chest and back. Pain is predominantly on the left side. She does not have a history of pancreatitis, but does endorse frequent alcohol intake. Lipase, however, was normal. She did have a leukocytosis. Examination reveals moderate tenderness on the left side of the abdomen diffusely. She did not, however, have  evidence of peritonitis. Patient underwent CT scan for further evaluation. CT scan does confirm acute and uncomplicated diverticulitis. Patient's vital signs are stable, normal. She is afebrile. Repeat examination reveals she was sleeping, resting comfortably with no distress. She is therefore a good candidate for outpatient treatment for diverticulitis, follow-up with gastroenterology. Return for worsening symptoms.  Final Clinical Impressions(s) / ED Diagnoses   Final diagnoses:  Diverticulitis    New Prescriptions New Prescriptions   CIPROFLOXACIN (CIPRO) 500 MG TABLET    Take 1 tablet (500 mg total) by mouth 2 (two) times daily.   METRONIDAZOLE (FLAGYL) 500 MG TABLET    Take 1 tablet (500 mg total) by mouth 3 (three) times daily.   TRAMADOL (ULTRAM) 50 MG TABLET    Take 1 tablet (50 mg total) by mouth every 6 (six) hours as needed.  I personally performed the services described in this documentation, which was scribed in my presence. The recorded information has been reviewed and is accurate.    Eulla Kochanowski,  Canary Brim, MD 10/06/16 [phone]

## 2018-12-21 IMAGING — CT CT ABD-PELV W/ CM
2 of 5 series · 16 of 46 positions shown, 18 images · IV contrast (ISOVUE)
Comparison: 02/04/2016 CT of abdomen and pelvis. 10/17/2014 MRI of
the abdomen.

CLINICAL DATA: 50 y/o F; left lower quadrant abdominal pain,
centralized chest and epigastric pain, and left flank pain.
Vomiting.

EXAM:
CT ABDOMEN AND PELVIS WITH CONTRAST
TECHNIQUE: Multidetector CT imaging of the abdomen and pelvis was performed
using the standard protocol following bolus administration of
intravenous contrast.
CONTRAST:  100mL H7HDEU-J66 IOPAMIDOL (H7HDEU-J66) INJECTION 61%

[Series 2: abd/pel with · axial · 0.98mm/px · z∈[-499,-119]mm · 13 of 90 slices shown, 15 images]
[im 7/90  soft-tissue]
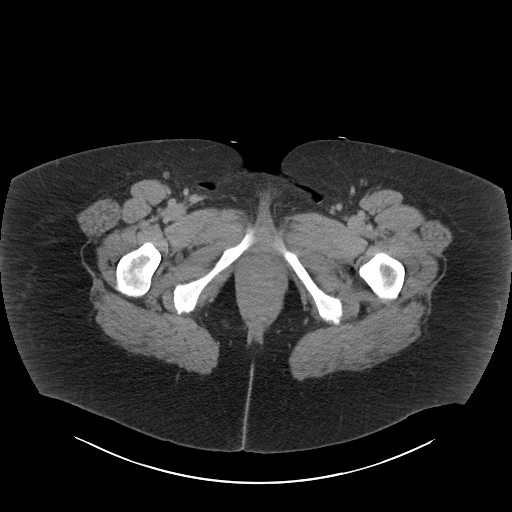
[im 7/90  bone]
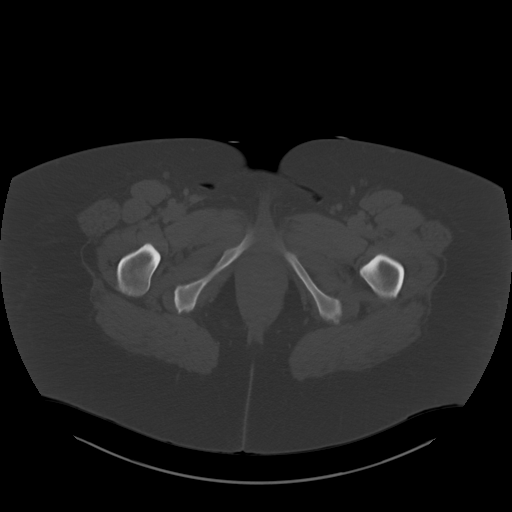
[im 13/90  soft-tissue]
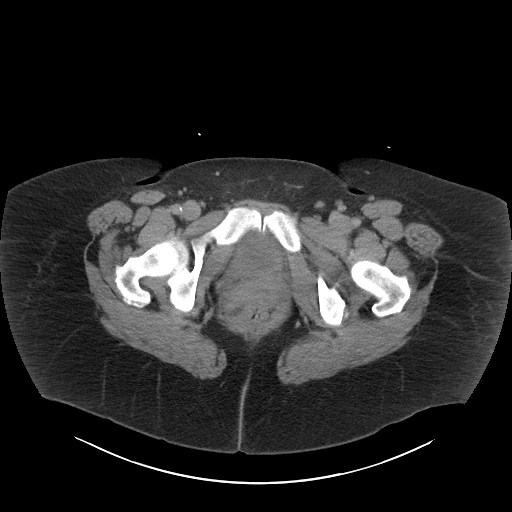
[im 20/90  soft-tissue]
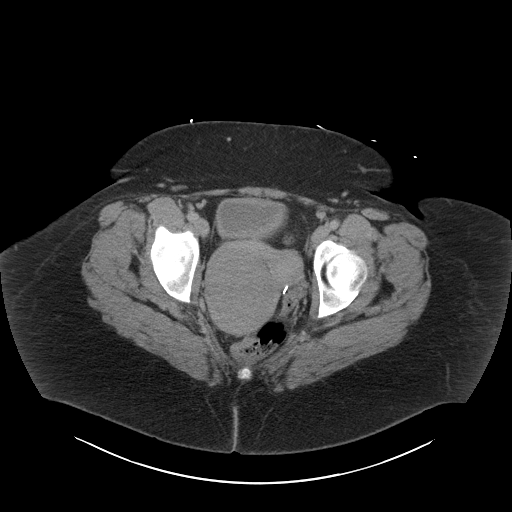
[im 26/90  soft-tissue]
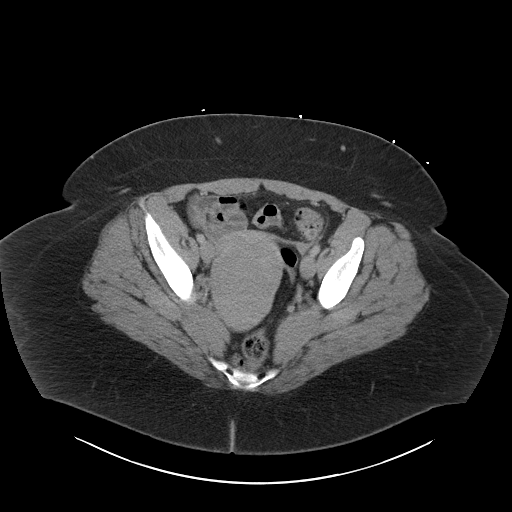
[im 32/90  soft-tissue]
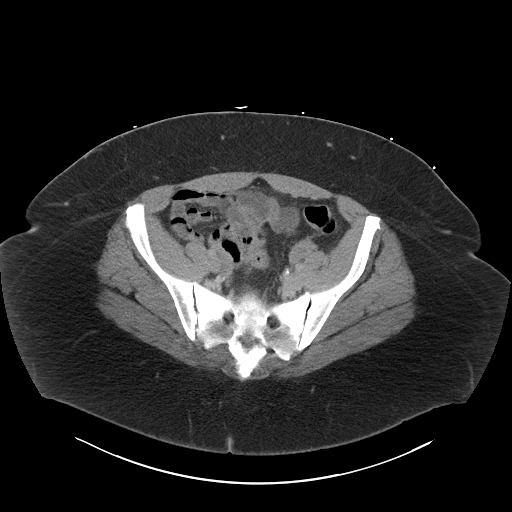
[im 39/90  soft-tissue]
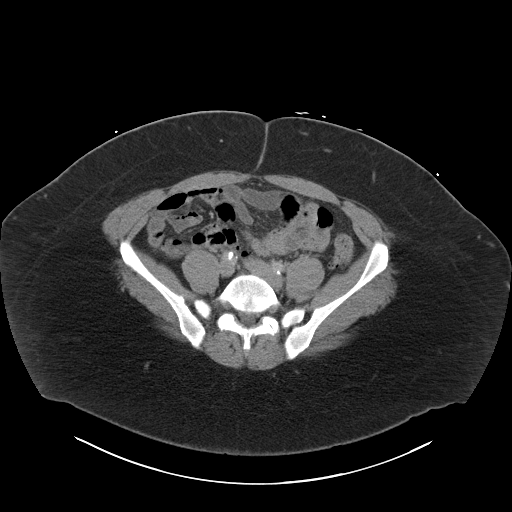
[im 45/90  soft-tissue]
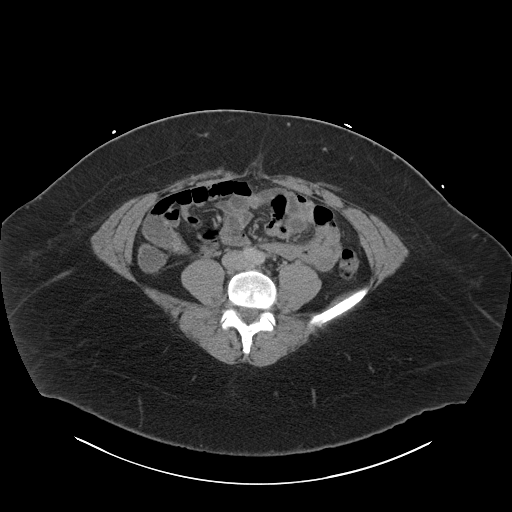
[im 51/90  soft-tissue]
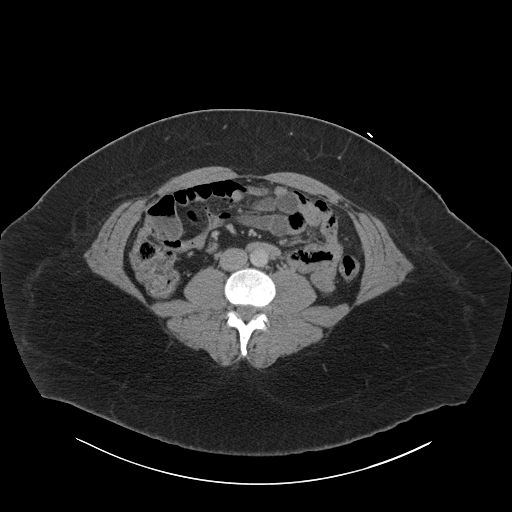
[im 58/90  soft-tissue]
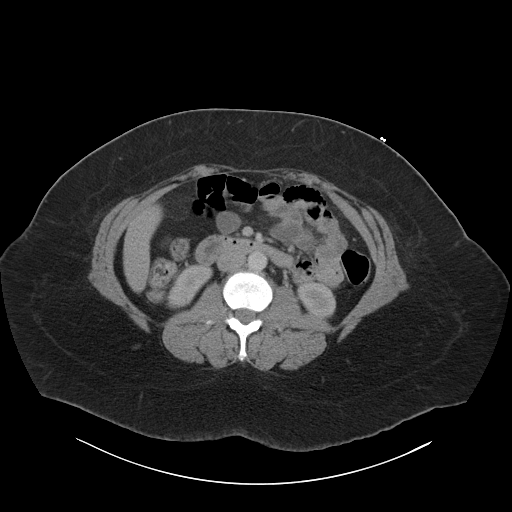
[im 58/90  bone]
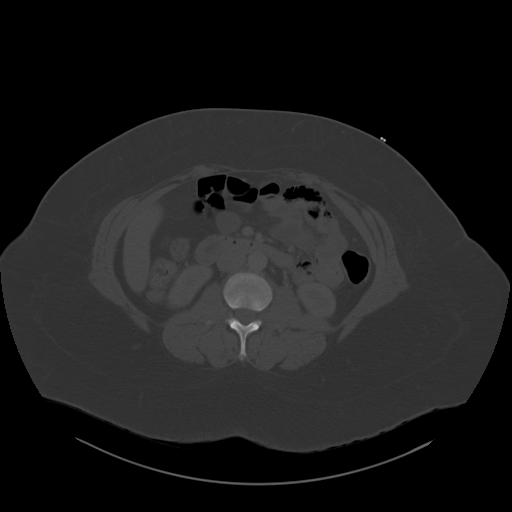
[im 64/90  soft-tissue]
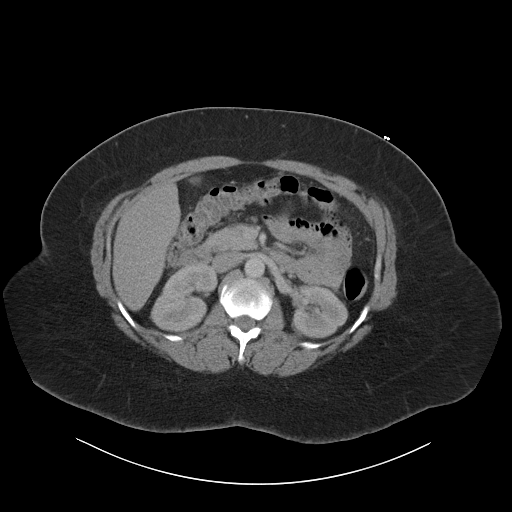
[im 70/90  soft-tissue]
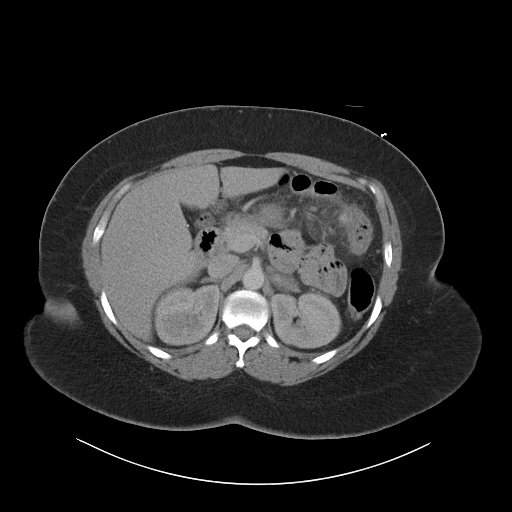
[im 77/90  soft-tissue]
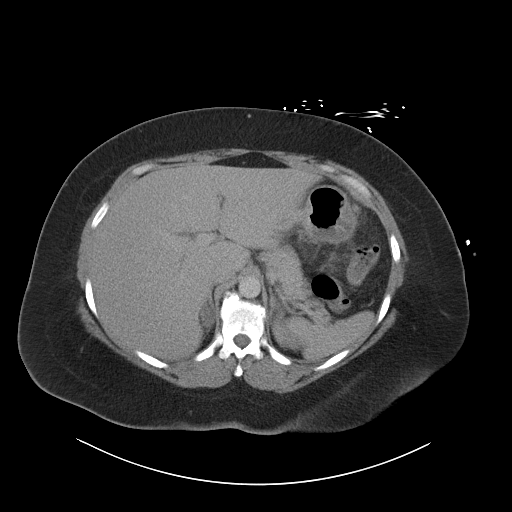
[im 83/90  soft-tissue]
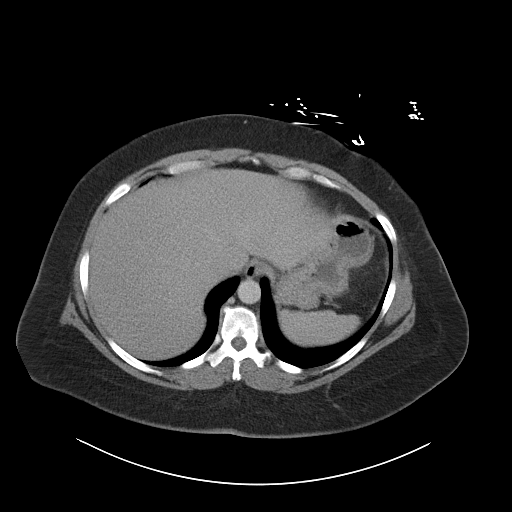

[Series 5: coronal a/|p · coronal · 0.88mm/px · 3 of 165 slices shown]
[im 55/165  soft-tissue]
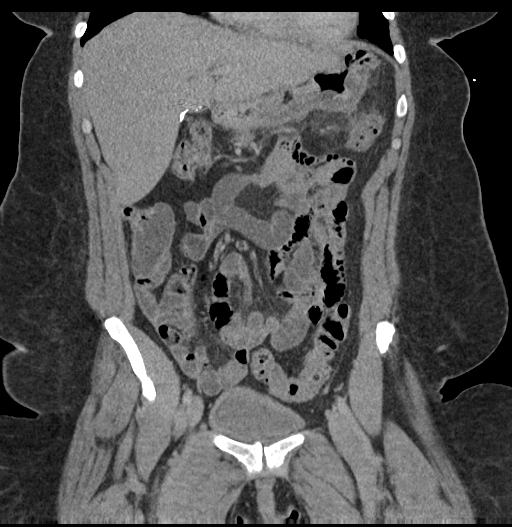
[im 73/165  soft-tissue]
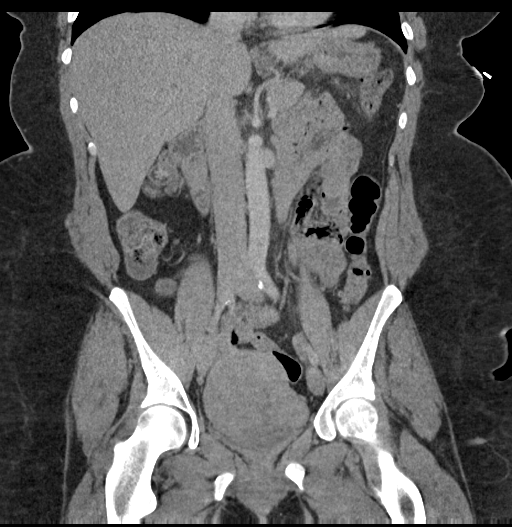
[im 92/165  soft-tissue]
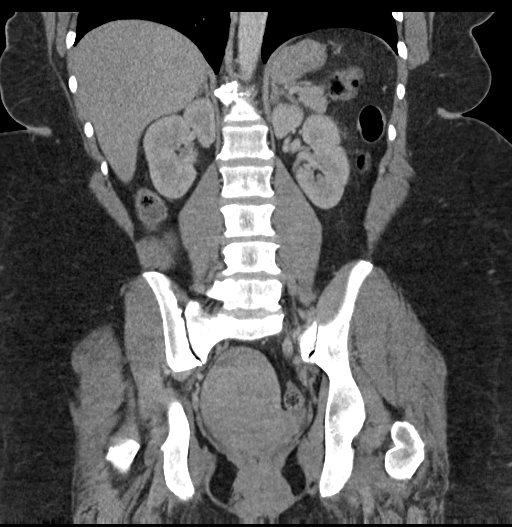

[16 of 46 positions shown; findings below may reference images not displayed]

FINDINGS: Lower chest: No acute abnormality.

Hepatobiliary: No focal liver abnormality is seen. Status post
cholecystectomy. No biliary dilatation.

Pancreas: Unremarkable. No pancreatic ductal dilatation or
surrounding inflammatory changes.

Spleen: Normal in size without focal abnormality.

Adrenals/Urinary Tract: Right adrenal nodule measuring 19 x 16 mm
and left adrenal nodule measuring 22 x 22 mm are stable and
compatible with adenoma. No focal kidney lesion. No renal stone
identified. No hydronephrosis. Normal bladder.

Stomach/Bowel: Inflammatory changes surrounding the splenic flexure
of the colon in the setting of diverticulosis compatible with acute
diverticulitis. No evidence for perforation or pericolonic abscess
at this time. Otherwise no inflammatory or obstructive changes of
bowel. Normal appendix.

Vascular/Lymphatic: Aortic atherosclerosis. No enlarged abdominal or
pelvic lymph nodes.

Reproductive: Stable myomatous uterus. No adnexal lesion identified.

Other: No abdominal wall hernia or abnormality. No abdominopelvic
ascites.

Musculoskeletal: No acute fracture. Sclerosis of sacroiliac joints
may be degenerative or represent condensing osteitis.
IMPRESSION: 1. Short segment of acute diverticulitis at the left splenic
flexure. No evidence for perforation or pericolonic abscess.
2. Bilateral stable adrenal adenoma.
3. Stable myomatous uterus.

By: Misokuhle Berend M.D.

## 2019-09-14 ENCOUNTER — Other Ambulatory Visit: Payer: Self-pay

## 2019-09-14 ENCOUNTER — Emergency Department (HOSPITAL_COMMUNITY): Payer: 59

## 2019-09-14 ENCOUNTER — Encounter (HOSPITAL_COMMUNITY): Payer: Self-pay | Admitting: *Deleted

## 2019-09-14 ENCOUNTER — Emergency Department (HOSPITAL_COMMUNITY)
Admission: EM | Admit: 2019-09-14 | Discharge: 2019-09-14 | Disposition: A | Discharge status: Home / Self Care | Payer: 59 | Attending: Emergency Medicine | Admitting: Emergency Medicine

## 2019-09-14 DIAGNOSIS — Z23 Encounter for immunization: Secondary | ICD-10-CM | POA: Diagnosis not present

## 2019-09-14 DIAGNOSIS — W25XXXA Contact with sharp glass, initial encounter: Secondary | ICD-10-CM | POA: Insufficient documentation

## 2019-09-14 DIAGNOSIS — F17210 Nicotine dependence, cigarettes, uncomplicated: Secondary | ICD-10-CM | POA: Diagnosis not present

## 2019-09-14 DIAGNOSIS — Y93G1 Activity, food preparation and clean up: Secondary | ICD-10-CM | POA: Insufficient documentation

## 2019-09-14 DIAGNOSIS — S61411A Laceration without foreign body of right hand, initial encounter: Secondary | ICD-10-CM | POA: Diagnosis not present

## 2019-09-14 DIAGNOSIS — Y929 Unspecified place or not applicable: Secondary | ICD-10-CM | POA: Diagnosis not present

## 2019-09-14 DIAGNOSIS — Y999 Unspecified external cause status: Secondary | ICD-10-CM | POA: Diagnosis not present

## 2019-09-14 MED ORDER — TETANUS-DIPHTH-ACELL PERTUSSIS 5-2.5-18.5 LF-MCG/0.5 IM SUSP
0.5000 mL | Freq: Once | INTRAMUSCULAR | Status: AC
  Administered 2019-09-14: 0.5 mL via INTRAMUSCULAR
  Filled 2019-09-14: qty 0.5

## 2019-09-14 MED ORDER — BACITRACIN ZINC 500 UNIT/GM EX OINT
TOPICAL_OINTMENT | Freq: Two times a day (BID) | CUTANEOUS | Status: DC
  Filled 2019-09-14: qty 0.9

## 2019-09-14 MED ORDER — LIDOCAINE HCL (PF) 1 % IJ SOLN
10.0000 mL | Freq: Once | INTRAMUSCULAR | Status: AC
  Administered 2019-09-14: 10 mL
  Filled 2019-09-14: qty 30

## 2019-09-14 MED ORDER — HYDROCODONE-ACETAMINOPHEN 5-325 MG PO TABS: 1.0000 | ORAL_TABLET | Freq: Four times a day (QID) | ORAL | 0 refills | Status: AC | PRN

## 2019-09-14 NOTE — ED Provider Notes (Addendum)
[facility]-EMERGENCY DEPT Provider Note   CSN: [id number] Arrival date & time: 09/14/19  1131     History Chief Complaint  Patient presents with  . Laceration    Nicole Ross is a 54 y.o. female.  Patient is a 54 year old female with no significant past medical history presenting to the emergency department for laceration to the right hand.  Patient reports that last night she was washing dishes and a glass broke and cut her.  Unknown last tetanus shot.        History reviewed. No pertinent past medical history.  Patient Active Problem List   Diagnosis Date Noted  . Choledocholithiasis with acute cholecystitis 10/17/2014  . Abdominal pain, acute 10/17/2014    Past Surgical History:  Procedure Laterality Date  . CHOLECYSTECTOMY N/A 10/18/2014   Procedure: LAPAROSCOPIC CHOLECYSTECTOMY WITH INTRAOPERATIVE CHOLANGIOGRAM;  Surgeon: Claud Kelp, MD;  Location: [facility];  Service: General;  Laterality: N/A;     OB History   No obstetric history on file.     No family history on file.  Social History   Tobacco Use  . Smoking status: Current Every Day Smoker    Types: Cigarettes  . Smokeless tobacco: Never Used  Substance Use Topics  . Alcohol use: Yes  . Drug use: No    Types: Marijuana    Home Medications Prior to Admission medications   Medication Sig Start Date End Date Taking? Authorizing Provider  acetaminophen (TYLENOL) 500 MG tablet Take 1,000 mg by mouth every 6 (six) hours as needed for mild pain or moderate pain.    [provider]  ciprofloxacin (CIPRO) 500 MG tablet Take 1 tablet (500 mg total) by mouth 2 (two) times daily. 10/06/16   Gilda Crease, MD  metroNIDAZOLE (FLAGYL) 500 MG tablet Take 1 tablet (500 mg total) by mouth 3 (three) times daily. 10/06/16   Gilda Crease, MD  promethazine (PHENERGAN) 12.5 MG tablet Take 1 tablet (12.5 mg total) by mouth every 6 (six) hours as needed for nausea or  vomiting. Patient not taking: Reported on 10/03/2015 10/19/14   Leroy Sea, MD  traMADol (ULTRAM) 50 MG tablet Take 1 tablet (50 mg total) by mouth every 6 (six) hours as needed. 10/06/16   Gilda Crease, MD    Allergies    Patient has no known allergies.  Review of Systems   Review of Systems  Constitutional: Negative for chills and fever.  Skin: Positive for wound. Negative for rash.  Allergic/Immunologic: Negative for immunocompromised state.  Hematological: Does not bruise/bleed easily.    Physical Exam Updated Vital Signs BP (!) 142/95 (BP Location: Left Arm)   Pulse 82   Temp 98.6 F (37 C) (Oral)   Resp 17   Ht 5\' 4"  (1.626 m)   Wt 136.1 kg   SpO2 98%   BMI 51.49 kg/m   Physical Exam Vitals and nursing note reviewed.  Constitutional:      Appearance: Normal appearance. She is obese.  HENT:     Head: Normocephalic.  Eyes:     Conjunctiva/sclera: Conjunctivae normal.  Pulmonary:     Effort: Pulmonary effort is normal.  Abdominal:     General: Abdomen is flat.  Musculoskeletal:     Comments: There is a elliptical laceration over the second MCP on the dorsum of the hand.  She has normal range of motion, sensation, strength.  Skin:    General: Skin is dry.     Capillary Refill:  Capillary refill takes less than 2 seconds.     Findings: Laceration present.  Neurological:     General: No focal deficit present.     Mental Status: She is alert.  Psychiatric:        Mood and Affect: Mood normal.     ED Results / Procedures / Treatments   Labs (all labs ordered are listed, but only abnormal results are displayed) Labs Reviewed - No data to display  EKG None  Radiology No results found.  Procedures .Marland KitchenLaceration Repair  Date/Time: 09/14/2019 3:46 PM Performed by: Alveria Apley, PA-C Authorized by: Alveria Apley, PA-C   Consent:    Consent obtained:  Verbal   Consent given by:  Patient   Risks discussed:  Infection, need for  additional repair, pain, poor cosmetic result and poor wound healing   Alternatives discussed:  No treatment and delayed treatment Universal protocol:    Procedure explained and questions answered to patient or proxy's satisfaction: yes     Relevant documents present and verified: yes     Test results available and properly labeled: yes     Imaging studies available: yes     Required blood products, implants, devices, and special equipment available: yes     Site/side marked: yes     Immediately prior to procedure, a time out was called: yes     Patient identity confirmed:  Verbally with patient Anesthesia (see MAR for exact dosages):    Anesthesia method:  Local infiltration   Local anesthetic:  Lidocaine 1% w/o epi Laceration details:    Length (cm):  3   Depth (mm):  4 Repair type:    Repair type:  Simple Pre-procedure details:    Preparation:  Patient was prepped and draped in usual sterile fashion Exploration:    Hemostasis achieved with:  Direct pressure   Wound exploration: wound explored through full range of motion     Wound extent: no nerve damage noted, no tendon damage noted, no underlying fracture noted and no vascular damage noted     Contaminated: no   Treatment:    Area cleansed with:  Soap and water and saline   Amount of cleaning:  Extensive   Irrigation solution:  Sterile saline   Irrigation method:  Syringe Skin repair:    Repair method:  Sutures   Suture size:  4-0   Suture material:  Nylon   Suture technique:  Simple interrupted   Number of sutures:  7 Approximation:    Approximation:  Close Post-procedure details:    Dressing:  Antibiotic ointment   Patient tolerance of procedure:  Tolerated well, no immediate complications   (including critical care time)  Medications Ordered in ED Medications  lidocaine (PF) (XYLOCAINE) 1 % injection 10 mL (10 mLs Infiltration Given by Other 09/14/19 1523)  Tdap (BOOSTRIX) injection 0.5 mL (0.5 mLs Intramuscular  Given 09/14/19 1551)    ED Course  I have reviewed the triage vital signs and the nursing notes.  Pertinent labs & imaging results that were available during my care of the patient were reviewed by me and considered in my medical decision making (see chart for details).    MDM Rules/Calculators/A&P                      Based on review of vitals, medical screening exam, lab work and/or imaging, there does not appear to be an acute, emergent etiology for the patient's symptoms. Counseled pt on  good return precautions and encouraged both PCP and ED follow-up as needed.  Prior to discharge, I also discussed incidental imaging findings with patient in detail and advised appropriate, recommended follow-up in detail.  Clinical Impression: 1. Laceration of right hand, foreign body presence unspecified, initial encounter     Disposition: Discharge  Prior to providing a prescription for a controlled substance, I independently reviewed the patient's recent prescription history on the [place] Controlled Substance Reporting System. The patient had no recent or regular prescriptions and was deemed appropriate for a brief, less than 3 day prescription of narcotic for acute analgesia.  This note was prepared with assistance of Conservation officer, historic buildings. Occasional wrong-word or sound-a-like substitutions may have occurred due to the inherent limitations of voice recognition software.  Final Clinical Impression(s) / ED Diagnoses Final diagnoses:  Laceration of right hand, foreign body presence unspecified, initial encounter    Rx / DC Orders ED Discharge Orders         Ordered    HYDROcodone-acetaminophen (NORCO/VICODIN) 5-325 MG tablet  Every 6 hours PRN     09/14/19 1618           Jeral Pinch 09/14/19 1556    Derwood Kaplan, MD 09/15/19 0845    Arlyn Dunning, PA-C 09/28/19 1351    Derwood Kaplan, MD 09/28/19 1557

## 2019-09-14 NOTE — Discharge Instructions (Signed)
You were seen today for a laceration to your hand.  Your x-ray was reassuring that there is no fracture or glass in your wound.  We have cleaned the wound out and repaired with staples.  Please keep it clean and dry for 24 to 48 hours.  After that, he may wash with gentle soap and water.  Keep it covered.  Sutures need to be removed in 10 to 14 days.  Follow-up with hand for a wound recheck.  Return to the emergency department if you have ANY new or worsening symptoms.

## 2019-09-14 NOTE — ED Notes (Signed)
ED Provider at bedside. 

## 2019-09-14 NOTE — ED Triage Notes (Signed)
Pt cut base of rt index finger last night about 9 pm washing dishes, glass resulting in cut.

## 2019-11-03 ENCOUNTER — Other Ambulatory Visit: Payer: Self-pay | Admitting: Orthopedic Surgery

## 2019-11-26 ENCOUNTER — Encounter (HOSPITAL_BASED_OUTPATIENT_CLINIC_OR_DEPARTMENT_OTHER): Payer: Self-pay | Admitting: Orthopedic Surgery

## 2019-11-26 ENCOUNTER — Other Ambulatory Visit: Payer: Self-pay

## 2019-12-03 ENCOUNTER — Encounter (HOSPITAL_BASED_OUTPATIENT_CLINIC_OR_DEPARTMENT_OTHER)
Admission: RE | Admit: 2019-12-03 | Discharge: 2019-12-03 | Disposition: A | Discharge status: Home / Self Care | Payer: 59 | Source: Ambulatory Visit | Attending: Orthopedic Surgery | Admitting: Orthopedic Surgery

## 2019-12-03 ENCOUNTER — Other Ambulatory Visit (HOSPITAL_COMMUNITY)
Admission: RE | Admit: 2019-12-03 | Discharge: 2019-12-03 | Disposition: A | Discharge status: Home / Self Care | Payer: 59 | Source: Ambulatory Visit | Attending: Orthopedic Surgery | Admitting: Orthopedic Surgery

## 2019-12-03 DIAGNOSIS — Z01812 Encounter for preprocedural laboratory examination: Secondary | ICD-10-CM | POA: Insufficient documentation

## 2019-12-03 DIAGNOSIS — Z20822 Contact with and (suspected) exposure to covid-19: Secondary | ICD-10-CM | POA: Diagnosis not present

## 2019-12-03 LAB — SARS CORONAVIRUS 2 (TAT 6-24 HRS): SARS Coronavirus 2: NEGATIVE

## 2019-12-03 NOTE — H&P (Signed)
Nicole Ross is an 54 y.o. female.   CC / Reason for Visit: Right index finger laceration HPI: This patient returns to clinic today for reevaluation of her right index finger, noting that she has no pain, no numbness and tingling, and good function, but as she extends her digit she notes a popping sensation on the dorsum of her hand.    HPI 09/29/19:This patient is a 54 year old, right-hand-dominant, female who lacerated her right index finger dorsal MP skin while washing dishes.  She went to the emergency department where she was evaluated, washed, and sutured.  She was referred here for further evaluation, wound check, and suture removal.  The patient has been working at [employer] regularly, applying a bandage and utilizing a glove.  She reports that she is not in any pain and that she is working on range of motion.  History reviewed. No pertinent past medical history.  Past Surgical History:  Procedure Laterality Date   CHOLECYSTECTOMY N/A 10/18/2014   Procedure: LAPAROSCOPIC CHOLECYSTECTOMY WITH INTRAOPERATIVE CHOLANGIOGRAM;  Surgeon: Nicole Kelp, MD;  Location: [facility];  Service: General;  Laterality: N/A;    History reviewed. No pertinent family history. Social History:  reports that she has been smoking cigarettes. She has never used smokeless tobacco. She reports current alcohol use. She reports that she does not use drugs.  Allergies: No Known Allergies  No medications prior to admission.    Results for orders placed or performed during the hospital encounter of 12/03/19 (from the past 48 hour(s))  SARS CORONAVIRUS 2 (TAT 6-24 HRS) Nasopharyngeal Nasopharyngeal Swab     Status: None   Collection Time: 12/03/19  9:41 AM   Specimen: Nasopharyngeal Swab  Result Value Ref Range   SARS Coronavirus 2 NEGATIVE NEGATIVE    Comment: (NOTE) SARS-CoV-2 target nucleic acids are NOT DETECTED.  The SARS-CoV-2 RNA is generally detectable in upper and lower respiratory specimens during  the acute phase of infection. Negative results do not preclude SARS-CoV-2 infection, do not rule out co-infections with other pathogens, and should not be used as the sole basis for treatment or other patient management decisions. Negative results must be combined with clinical observations, patient history, and epidemiological information. The expected result is Negative.  Fact Sheet for Patients: HairSlick.no  Fact Sheet for Healthcare Providers: quierodirigir.com  This test is not yet approved or cleared by the [place] FDA and  has been authorized for detection and/or diagnosis of SARS-CoV-2 by FDA under an Emergency Use Authorization (EUA). This EUA will remain  in effect (meaning this test can be used) for the duration of the COVID-19 declaration under Se ction 564(b)(1) of the Act, 21 U.S.C. section 360bbb-3(b)(1), unless the authorization is terminated or revoked sooner.  Performed at [facility], [address], [place], [place] [address]    No results found.  Review of Systems  All other systems reviewed and are negative.   Height 5\' 6"  (1.676 m), weight (!) 146.7 kg. Physical Exam  Constitutional:  WD, WN, NAD HEENT:  NCAT, EOMI Neuro/Psych:  Alert & oriented to person, place, and time; appropriate mood & affect Lymphatic: No generalized UE edema or lymphadenopathy Extremities / MSK:  Both UE are normal with respect to appearance, ranges of motion, joint stability, muscle strength/tone, sensation, & perfusion except as otherwise noted:  There is a 3 cm half-moon scar which is well-healed over the dorsal MP of the right index finger.  There is no extensor lag at the MP.  The patient is capable of full extension of the digit but there is a notable subluxation of the extensor tendon with each flexion and extension cycle.   NVI.  Labs / Xrays:  No radiographic studies obtained  today.  Assessment: Right hand laceration now with subluxating extensor tendon  Plan:  The findings are discussed with the patient.  Surgical intervention for this particular problem was discussed at length including the rehabilitation portion.  The splinting would consist of blocking the MPs into extension only but still allowing PIP and DIP flexion which would allow her to have light use of her hand during the rehabilitation process.The details of the operative procedure were discussed with the patient.  Questions were invited and answered.  In addition to the goal of the procedure, the risks of the procedure to include but not limited to bleeding; infection; damage to the nerves or blood vessels that could result in bleeding, numbness, weakness, chronic pain, and the need for additional procedures; stiffness; the need for revision surgery; and anesthetic risks were reviewed.  No specific outcome was guaranteed or implied.  She will speak with her family members and then contact our surgery coordinator to determine whether or not she wishes to move forward with right index finger extensor tendon stabilization.  Jodi Marble, MD 12/03/2019, 6:28 PM

## 2019-12-03 NOTE — Progress Notes (Signed)

## 2019-12-03 NOTE — Progress Notes (Signed)
Anesthesia consult with Dr. Arby Barrette, ok to proceed as planned with surgery at [facility].

## 2019-12-06 NOTE — Anesthesia Preprocedure Evaluation (Addendum)
Anesthesia Evaluation  Patient identified by MRN, date of birth, ID band Patient awake    Reviewed: Allergy & Precautions, H&P , NPO status , Patient's Chart, lab work & pertinent test results  Airway Mallampati: I  TM Distance: >3 FB Neck ROM: Full    Dental no notable dental hx. (+) Poor Dentition, Chipped, Missing, Loose,    Pulmonary neg pulmonary ROS, Current Smoker,    Pulmonary exam normal breath sounds clear to auscultation       Cardiovascular Exercise Tolerance: Good negative cardio ROS Normal cardiovascular exam Rhythm:Regular Rate:Normal     Neuro/Psych negative neurological ROS  negative psych ROS   GI/Hepatic negative GI ROS, Neg liver ROS,   Endo/Other  Morbid obesity  Renal/GU negative Renal ROS  negative genitourinary   Musculoskeletal negative musculoskeletal ROS (+)   Abdominal   Peds negative pediatric ROS (+)  Hematology negative hematology ROS (+)   Anesthesia Other Findings   Reproductive/Obstetrics negative OB ROS                            Anesthesia Physical Anesthesia Plan  ASA: III  Anesthesia Plan: MAC   Post-op Pain Management:    Induction: Intravenous  PONV Risk Score and Plan: 2  Airway Management Planned: Mask and Natural Airway  Additional Equipment:   Intra-op Plan:   Post-operative Plan:   Informed Consent: I have reviewed the patients History and Physical, chart, labs and discussed the procedure including the risks, benefits and alternatives for the proposed anesthesia with the patient or authorized representative who has indicated his/her understanding and acceptance.       Plan Discussed with: Anesthesiologist and CRNA  Anesthesia Plan Comments:         Anesthesia Quick Evaluation

## 2019-12-07 ENCOUNTER — Ambulatory Visit (HOSPITAL_BASED_OUTPATIENT_CLINIC_OR_DEPARTMENT_OTHER): Payer: 59 | Admitting: Anesthesiology

## 2019-12-07 ENCOUNTER — Encounter (HOSPITAL_BASED_OUTPATIENT_CLINIC_OR_DEPARTMENT_OTHER)
Admission: RE | Disposition: A | Discharge status: Home / Self Care | Payer: Self-pay | Source: Home / Self Care | Attending: Orthopedic Surgery

## 2019-12-07 ENCOUNTER — Other Ambulatory Visit: Payer: Self-pay

## 2019-12-07 ENCOUNTER — Encounter (HOSPITAL_BASED_OUTPATIENT_CLINIC_OR_DEPARTMENT_OTHER): Payer: Self-pay | Admitting: Orthopedic Surgery

## 2019-12-07 ENCOUNTER — Ambulatory Visit (HOSPITAL_BASED_OUTPATIENT_CLINIC_OR_DEPARTMENT_OTHER)
Admission: RE | Admit: 2019-12-07 | Discharge: 2019-12-07 | Disposition: A | Discharge status: Home / Self Care | Payer: 59 | Attending: Orthopedic Surgery | Admitting: Orthopedic Surgery

## 2019-12-07 DIAGNOSIS — F17210 Nicotine dependence, cigarettes, uncomplicated: Secondary | ICD-10-CM | POA: Insufficient documentation

## 2019-12-07 DIAGNOSIS — S66320A Laceration of extensor muscle, fascia and tendon of right index finger at wrist and hand level, initial encounter: Secondary | ICD-10-CM | POA: Insufficient documentation

## 2019-12-07 DIAGNOSIS — W458XXA Other foreign body or object entering through skin, initial encounter: Secondary | ICD-10-CM | POA: Insufficient documentation

## 2019-12-07 DIAGNOSIS — Z6841 Body Mass Index (BMI) 40.0 and over, adult: Secondary | ICD-10-CM | POA: Diagnosis not present

## 2019-12-07 DIAGNOSIS — M67843 Other specified disorders of tendon, right hand: Secondary | ICD-10-CM | POA: Diagnosis present

## 2019-12-07 HISTORY — PX: REPAIR EXTENSOR TENDON: SHX5382

## 2019-12-07 LAB — POCT PREGNANCY, URINE: Preg Test, Ur: NEGATIVE

## 2019-12-07 SURGERY — REPAIR, TENDON, EXTENSOR
Anesthesia: Monitor Anesthesia Care | Site: Hand | Laterality: Right

## 2019-12-07 MED ORDER — IBUPROFEN 200 MG PO TABS
600.0000 mg | ORAL_TABLET | Freq: Four times a day (QID) | ORAL | Status: DC
Start: 2019-12-07 — End: 2023-03-08

## 2019-12-07 MED ORDER — CEFAZOLIN SODIUM-DEXTROSE 1-4 GM/50ML-% IV SOLN
INTRAVENOUS | Status: AC
  Filled 2019-12-07: qty 50

## 2019-12-07 MED ORDER — LIDOCAINE-EPINEPHRINE 1 %-1:100000 IJ SOLN
INTRAMUSCULAR | Status: DC | PRN
  Administered 2019-12-07: 5 mL

## 2019-12-07 MED ORDER — OXYCODONE HCL 5 MG PO TABS: 5.0000 mg | ORAL_TABLET | Freq: Four times a day (QID) | ORAL | 0 refills | Status: DC | PRN

## 2019-12-07 MED ORDER — FENTANYL CITRATE (PF) 100 MCG/2ML IJ SOLN
INTRAMUSCULAR | Status: DC | PRN
  Administered 2019-12-07: 100 ug via INTRAVENOUS

## 2019-12-07 MED ORDER — CEFAZOLIN SODIUM-DEXTROSE 2-4 GM/100ML-% IV SOLN
INTRAVENOUS | Status: AC
  Filled 2019-12-07: qty 100

## 2019-12-07 MED ORDER — BUPIVACAINE HCL (PF) 0.5 % IJ SOLN
INTRAMUSCULAR | Status: AC
  Filled 2019-12-07: qty 30

## 2019-12-07 MED ORDER — MIDAZOLAM HCL 5 MG/5ML IJ SOLN
INTRAMUSCULAR | Status: DC | PRN
  Administered 2019-12-07: 2 mg via INTRAVENOUS

## 2019-12-07 MED ORDER — LACTATED RINGERS IV SOLN: INTRAVENOUS | Status: DC

## 2019-12-07 MED ORDER — ONDANSETRON HCL 4 MG/2ML IJ SOLN
INTRAMUSCULAR | Status: DC | PRN
  Administered 2019-12-07: 4 mg via INTRAVENOUS

## 2019-12-07 MED ORDER — BUPIVACAINE HCL 0.5 % IJ SOLN
INTRAMUSCULAR | Status: DC | PRN
  Administered 2019-12-07: 5 mL

## 2019-12-07 MED ORDER — LIDOCAINE HCL (CARDIAC) PF 100 MG/5ML IV SOSY
PREFILLED_SYRINGE | INTRAVENOUS | Status: DC | PRN
  Administered 2019-12-07: 60 mg via INTRAVENOUS

## 2019-12-07 MED ORDER — BUPIVACAINE HCL (PF) 0.25 % IJ SOLN
INTRAMUSCULAR | Status: AC
  Filled 2019-12-07: qty 30

## 2019-12-07 MED ORDER — ACETAMINOPHEN 325 MG PO TABS
650.0000 mg | ORAL_TABLET | Freq: Four times a day (QID) | ORAL | Status: DC
Start: 2019-12-07 — End: 2023-03-08

## 2019-12-07 MED ORDER — MIDAZOLAM HCL 2 MG/2ML IJ SOLN
INTRAMUSCULAR | Status: AC
  Filled 2019-12-07: qty 2

## 2019-12-07 MED ORDER — DEXTROSE 5 % IV SOLN
3.0000 g | INTRAVENOUS | Status: DC
  Filled 2019-12-07: qty 3000

## 2019-12-07 MED ORDER — FENTANYL CITRATE (PF) 100 MCG/2ML IJ SOLN
INTRAMUSCULAR | Status: AC
  Filled 2019-12-07: qty 2

## 2019-12-07 MED ORDER — LIDOCAINE-EPINEPHRINE 1 %-1:100000 IJ SOLN
INTRAMUSCULAR | Status: AC
  Filled 2019-12-07: qty 1

## 2019-12-07 MED ORDER — PROPOFOL 500 MG/50ML IV EMUL
INTRAVENOUS | Status: DC | PRN
  Administered 2019-12-07: 75 ug/kg/min via INTRAVENOUS

## 2019-12-07 SURGICAL SUPPLY — 64 items
BLADE MINI RND TIP GREEN BEAV (BLADE) IMPLANT
BLADE SURG 15 STRL LF DISP TIS (BLADE) ×1 IMPLANT
BLADE SURG 15 STRL SS (BLADE) ×3
BNDG COHESIVE 1X5 TAN STRL LF (GAUZE/BANDAGES/DRESSINGS) ×3 IMPLANT
BNDG COHESIVE 4X5 TAN STRL (GAUZE/BANDAGES/DRESSINGS) ×3 IMPLANT
BNDG CONFORM 2 STRL LF (GAUZE/BANDAGES/DRESSINGS) IMPLANT
BNDG ESMARK 4X9 LF (GAUZE/BANDAGES/DRESSINGS) ×3 IMPLANT
BNDG GAUZE ELAST 4 BULKY (GAUZE/BANDAGES/DRESSINGS) ×3 IMPLANT
CHLORAPREP W/TINT 26 (MISCELLANEOUS) ×3 IMPLANT
CORD BIPOLAR FORCEPS 12FT (ELECTRODE) ×3 IMPLANT
COVER BACK TABLE 60X90IN (DRAPES) ×3 IMPLANT
COVER MAYO STAND STRL (DRAPES) ×3 IMPLANT
COVER WAND RF STERILE (DRAPES) IMPLANT
CUFF TOURN SGL QUICK 18X4 (TOURNIQUET CUFF) ×3 IMPLANT
DECANTER SPIKE VIAL GLASS SM (MISCELLANEOUS) ×6 IMPLANT
DEPRESSOR TONGUE BLADE STERILE (MISCELLANEOUS) ×3 IMPLANT
DRAPE EXTREMITY T 121X128X90 (DISPOSABLE) ×3 IMPLANT
DRAPE SURG 17X23 STRL (DRAPES) ×3 IMPLANT
DRSG EMULSION OIL 3X3 NADH (GAUZE/BANDAGES/DRESSINGS) ×3 IMPLANT
GAUZE SPONGE 4X4 12PLY STRL LF (GAUZE/BANDAGES/DRESSINGS) ×6 IMPLANT
GLOVE BIO SURGEON STRL SZ7.5 (GLOVE) ×3 IMPLANT
GLOVE BIOGEL M 6.5 STRL (GLOVE) ×3 IMPLANT
GLOVE BIOGEL PI IND STRL 7.0 (GLOVE) ×1 IMPLANT
GLOVE BIOGEL PI IND STRL 8 (GLOVE) ×1 IMPLANT
GLOVE BIOGEL PI INDICATOR 7.0 (GLOVE) ×2
GLOVE BIOGEL PI INDICATOR 8 (GLOVE) ×2
GLOVE ECLIPSE 6.5 STRL STRAW (GLOVE) ×3 IMPLANT
GOWN STRL REUS W/ TWL LRG LVL3 (GOWN DISPOSABLE) ×2 IMPLANT
GOWN STRL REUS W/TWL LRG LVL3 (GOWN DISPOSABLE) ×6
GOWN STRL REUS W/TWL XL LVL3 (GOWN DISPOSABLE) ×3 IMPLANT
LOOP VESSEL MAXI BLUE (MISCELLANEOUS) IMPLANT
LOOP VESSEL MINI RED (MISCELLANEOUS) IMPLANT
NEEDLE HYPO 25X1 1.5 SAFETY (NEEDLE) ×3 IMPLANT
NS IRRIG 1000ML POUR BTL (IV SOLUTION) ×3 IMPLANT
PACK BASIN DAY SURGERY FS (CUSTOM PROCEDURE TRAY) ×3 IMPLANT
PADDING CAST ABS 3INX4YD NS (CAST SUPPLIES)
PADDING CAST ABS 4INX4YD NS (CAST SUPPLIES) ×2
PADDING CAST ABS COTTON 3X4 (CAST SUPPLIES) IMPLANT
PADDING CAST ABS COTTON 4X4 ST (CAST SUPPLIES) ×1 IMPLANT
SLEEVE SCD COMPRESS KNEE MED (MISCELLANEOUS) IMPLANT
SLING ARM FOAM STRAP LRG (SOFTGOODS) IMPLANT
SPLINT PLASTER CAST XFAST 3X15 (CAST SUPPLIES) ×7 IMPLANT
SPLINT PLASTER CAST XFAST 4X15 (CAST SUPPLIES) IMPLANT
SPLINT PLASTER XTRA FAST SET 4 (CAST SUPPLIES)
SPLINT PLASTER XTRA FASTSET 3X (CAST SUPPLIES) ×14
STOCKINETTE 6  STRL (DRAPES) ×3
STOCKINETTE 6 STRL (DRAPES) ×1 IMPLANT
SUT ETHIBOND 3-0 V-5 (SUTURE) IMPLANT
SUT FIBERWIRE 2-0 18 17.9 3/8 (SUTURE)
SUT MERSILENE 4 0 P 3 (SUTURE) IMPLANT
SUT PROLENE 6 0 P 1 18 (SUTURE) IMPLANT
SUT SILK 4 0 PS 2 (SUTURE) IMPLANT
SUT STEEL 4 (SUTURE) IMPLANT
SUT SUPRAMID 3-0 (SUTURE) ×3 IMPLANT
SUT SUPRAMID 4-0 (SUTURE) IMPLANT
SUT VICRYL RAPIDE 4-0 (SUTURE) IMPLANT
SUT VICRYL RAPIDE 4/0 PS 2 (SUTURE) ×3 IMPLANT
SUTURE FIBERWR 2-0 18 17.9 3/8 (SUTURE) IMPLANT
SYR 10ML LL (SYRINGE) IMPLANT
SYR BULB EAR ULCER 3OZ GRN STR (SYRINGE) ×3 IMPLANT
TOWEL GREEN STERILE FF (TOWEL DISPOSABLE) ×3 IMPLANT
TUBE CONNECTING 20'X1/4 (TUBING)
TUBE CONNECTING 20X1/4 (TUBING) IMPLANT
UNDERPAD 30X36 HEAVY ABSORB (UNDERPADS AND DIAPERS) ×3 IMPLANT

## 2019-12-07 NOTE — Anesthesia Procedure Notes (Signed)
Procedure Name: MAC Date/Time: 12/07/2019 7:48 AM Performed by: Signe Colt, CRNA Pre-anesthesia Checklist: Patient identified, Emergency Drugs available, Suction available, Patient being monitored and Timeout performed Patient Re-evaluated:Patient Re-evaluated prior to induction Oxygen Delivery Method: Simple face mask

## 2019-12-07 NOTE — Anesthesia Postprocedure Evaluation (Signed)
Anesthesia Post Note  Patient: Nicole Ross  Procedure(s) Performed: REALIGNMENT OF RIGHT INDEX FINGER EXTENSOR TENDON (Right Hand)     Patient location during evaluation: PACU Anesthesia Type: MAC Level of consciousness: awake and alert Pain management: pain level controlled Vital Signs Assessment: post-procedure vital signs reviewed and stable Respiratory status: spontaneous breathing, nonlabored ventilation, respiratory function stable and patient connected to nasal cannula oxygen Cardiovascular status: stable and blood pressure returned to baseline Postop Assessment: no apparent nausea or vomiting Anesthetic complications: no   No complications documented.  Last Vitals:  Vitals:   12/07/19 0830 12/07/19 0900  BP: 139/82 129/76  Pulse: 64 (!) 59  Resp: 18 16  Temp:  36.5 C  SpO2: 100% 100%    Last Pain:  Vitals:   12/07/19 0900  TempSrc: Oral  PainSc: 0-No pain                 Keyra Virella

## 2019-12-07 NOTE — Transfer of Care (Signed)
Immediate Anesthesia Transfer of Care Note  Patient: Nicole Ross  Procedure(s) Performed: REALIGNMENT OF RIGHT INDEX FINGER EXTENSOR TENDON (Right Hand)  Patient Location: PACU  Anesthesia Type:MAC  Level of Consciousness: awake, alert , oriented and patient cooperative  Airway & Oxygen Therapy: Patient Spontanous Breathing and Patient connected to face mask oxygen  Post-op Assessment: Report given to RN and Post -op Vital signs reviewed and stable  Post vital signs: Reviewed and stable  Last Vitals:  Vitals Value Taken Time  BP    Temp    Pulse 64 12/07/19 0816  Resp    SpO2 100 % 12/07/19 0816  Vitals shown include unvalidated device data.  Last Pain:  Vitals:   12/07/19 9201  TempSrc: Oral  PainSc: 0-No pain         Complications: No complications documented.

## 2019-12-07 NOTE — Discharge Instructions (Signed)
Discharge Instructions   You have a dressing with a plaster splint incorporated in it. Move your fingers as much as possible, making a full fist and fully opening the fist. Elevate your hand to reduce pain & swelling of the digits.  Ice over the operative site may be helpful to reduce pain & swelling.  DO NOT USE HEAT. Pain medicine has been prescribed for you.  Take Tylenol 650 mg and Ibuprofen 600 mg every 6 hours together. Take Oxycodone 5 mg additionally for severe post operative pain as a rescue medicine. Leave the dressing in place until you return to our office.  You may shower, but keep the bandage clean & dry.  You may drive a car when you are off of prescription pain medications and can safely control your vehicle with both hands. Our office will call you to arrange follow-up   Please call [phone] during normal business hours or [phone] after hours for any problems. Including the following:  - excessive redness of the incisions - drainage for more than 4 days - fever of more than 101.5 F  *Please note that pain medications will not be refilled after hours or on weekends.  WORK STATUS: Patient is out of work until first post operative appointment.   Post Anesthesia Home Care Instructions  Activity: Get plenty of rest for the remainder of the day. A responsible individual must stay with you for 24 hours following the procedure.  For the next 24 hours, DO NOT: -Drive a car -Advertising copywriter -Drink alcoholic beverages -Take any medication unless instructed by your physician -Make any legal decisions or sign important papers.  Meals: Start with liquid foods such as gelatin or soup. Progress to regular foods as tolerated. Avoid greasy, spicy, heavy foods. If nausea and/or vomiting occur, drink only clear liquids until the nausea and/or vomiting subsides. Call your physician if vomiting continues.  Special Instructions/Symptoms: Your throat may feel dry or sore  from the anesthesia or the breathing tube placed in your throat during surgery. If this causes discomfort, gargle with warm salt water. The discomfort should disappear within 24 hours.  If you had a scopolamine patch placed behind your ear for the management of post- operative nausea and/or vomiting:  1. The medication in the patch is effective for 72 hours, after which it should be removed.  Wrap patch in a tissue and discard in the trash. Wash hands thoroughly with soap and water. 2. You may remove the patch earlier than 72 hours if you experience unpleasant side effects which may include dry mouth, dizziness or visual disturbances. 3. Avoid touching the patch. Wash your hands with soap and water after contact with the patch.

## 2019-12-07 NOTE — Interval H&P Note (Signed)
History and Physical Interval Note:  12/07/2019 7:37 AM  Nicole Ross  has presented today for surgery, with the diagnosis of RIGHT INDEX FINGER EXTENSOR TENDON DISLOCATION.  The various methods of treatment have been discussed with the patient and family. After consideration of risks, benefits and other options for treatment, the patient has consented to  Procedure(s) with comments: REALIGNMENT OF RIGHT INDEX FINGER EXTENSOR TENDON (Right) - PROCEDURE: REALIGNMENT OF RIGHT INDEX FINGER EXTENSOR TENDON  LENGTH OF SURGERY: 45 MIN as a surgical intervention.  The patient's history has been reviewed, patient examined, no change in status, stable for surgery.  I have reviewed the patient's chart and labs.  Questions were answered to the patient's satisfaction.     Jodi Marble

## 2019-12-07 NOTE — Op Note (Signed)
12/07/2019  8:17 AM  PATIENT:  Nicole Ross  54 y.o. female  PRE-OPERATIVE DIAGNOSIS: Subluxating right index extensor tendon following healed laceration  POST-OPERATIVE DIAGNOSIS:  Same  PROCEDURE: Right index finger radial sagittal band repair  SURGEON: Rayvon Char. Grandville Silos, MD  PHYSICIAN ASSISTANT: Morley Kos, OPA-C  ANESTHESIA:  local and MAC  SPECIMENS:  None  DRAINS:   None  EBL:  less than 50 mL  PREOPERATIVE INDICATIONS:  Nicole Ross is a  54 y.o. female with ulnarly subluxating extensor tendon of the index finger following healed traumatic laceration  The risks benefits and alternatives were discussed with the patient preoperatively including but not limited to the risks of infection, bleeding, nerve injury, cardiopulmonary complications, the need for revision surgery, among others, and the patient verbalized understanding and consented to proceed.  OPERATIVE IMPLANTS: None  OPERATIVE PROCEDURE:  After receiving prophylactic antibiotics, the patient was escorted to the operative theatre and placed in a supine position.  A surgical "time-out" was performed during which the planned procedure, proposed operative site, and the correct patient identity were compared to the operative consent and agreement confirmed by the circulating nurse according to current facility policy.  Local anesthetic was infiltrated about the planned surgical site.  Following application of a tourniquet to the operative extremity, the exposed skin was prepped with Chloraprep and draped in the usual sterile fashion.  The limb was exsanguinated with an Esmarch bandage and the tourniquet inflated to approximately 149mHg higher than systolic BP.  The previous laceration was elliptically excised and extended slightly proximally in a curvilinear fashion.  Full-thickness flaps were elevated.  The extensor tendon in the sagittal bands were dissected free of adhesions from the overlying skin and  subcutaneous structures as well as the deeper structures.  There was a laceration noted to the proximal portions of the radial sagittal band with subluxation and some tightening of the ulnar sagittal band.  The ulnar side was released for a few millimeters proximally and this allowed for better centralization of the tendon.  The radial sagittal band was then repaired with a combination of horizontal mattress and figure-of-eight sutures providing a pants over vest type of repair and advancement of the sagittal band tissue.  This nicely stabilized the tendon over the apex of the joint, even when passively the digit was flexed fully.  The tourniquet was released, the wound irrigated and additional hemostasis unnecessary.  The skin was closed with 4-0 Vicryl Rapide interrupted horizontal mattress sutures and a short arm splint dressing was applied placing the MP joints in neutral.  She was taken to recovery room in stable condition.  DISPOSITION: She will be discharged home today, returning to therapy in a few days to have a splint fabricated and begin her rehab, seeing Nicole Ross 7 to 10 days.

## 2019-12-08 ENCOUNTER — Encounter (HOSPITAL_BASED_OUTPATIENT_CLINIC_OR_DEPARTMENT_OTHER): Payer: Self-pay | Admitting: Orthopedic Surgery
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
## 2023-03-08 ENCOUNTER — Other Ambulatory Visit: Payer: Self-pay

## 2023-03-08 ENCOUNTER — Encounter (HOSPITAL_COMMUNITY): Payer: Self-pay | Admitting: *Deleted

## 2023-03-08 ENCOUNTER — Emergency Department (HOSPITAL_COMMUNITY): Payer: Worker's Compensation

## 2023-03-08 ENCOUNTER — Emergency Department (HOSPITAL_COMMUNITY)
Admission: EM | Admit: 2023-03-08 | Discharge: 2023-03-08 | Disposition: A | Discharge status: Home / Self Care | Payer: Worker's Compensation | Attending: Emergency Medicine | Admitting: Emergency Medicine

## 2023-03-08 DIAGNOSIS — N644 Mastodynia: Secondary | ICD-10-CM | POA: Insufficient documentation

## 2023-03-08 DIAGNOSIS — T59811A Toxic effect of smoke, accidental (unintentional), initial encounter: Secondary | ICD-10-CM | POA: Diagnosis not present

## 2023-03-08 DIAGNOSIS — R0602 Shortness of breath: Secondary | ICD-10-CM | POA: Diagnosis present

## 2023-03-08 LAB — CBC WITH DIFFERENTIAL/PLATELET
Abs Immature Granulocytes: 0.04 10*3/uL (ref 0.00–0.07)
Basophils Absolute: 0 10*3/uL (ref 0.0–0.1)
Basophils Relative: 0 %
Eosinophils Absolute: 0.1 10*3/uL (ref 0.0–0.5)
Eosinophils Relative: 1 %
HCT: 39.8 % (ref 36.0–46.0)
Hemoglobin: 13.2 g/dL (ref 12.0–15.0)
Immature Granulocytes: 1 %
Lymphocytes Relative: 33 %
Lymphs Abs: 2.4 10*3/uL (ref 0.7–4.0)
MCH: 32.8 pg (ref 26.0–34.0)
MCHC: 33.2 g/dL (ref 30.0–36.0)
MCV: 98.8 fL (ref 80.0–100.0)
Monocytes Absolute: 0.8 10*3/uL (ref 0.1–1.0)
Monocytes Relative: 11 %
Neutro Abs: 4 10*3/uL (ref 1.7–7.7)
Neutrophils Relative %: 54 %
Platelets: 214 10*3/uL (ref 150–400)
RBC: 4.03 MIL/uL (ref 3.87–5.11)
RDW: 15.1 % (ref 11.5–15.5)
WBC: 7.3 10*3/uL (ref 4.0–10.5)
nRBC: 0 % (ref 0.0–0.2)

## 2023-03-08 LAB — BASIC METABOLIC PANEL
Anion gap: 11 (ref 5–15)
BUN: 7 mg/dL (ref 6–20)
CO2: 25 mmol/L (ref 22–32)
Calcium: 9.6 mg/dL (ref 8.9–10.3)
Chloride: 104 mmol/L (ref 98–111)
Creatinine, Ser: 0.98 mg/dL (ref 0.44–1.00)
GFR, Estimated: >60 mL/min (ref >60)
Glucose, Bld: 86 mg/dL (ref 70–99)
Potassium: 4 mmol/L (ref 3.5–5.1)
Sodium: 140 mmol/L (ref 135–145)

## 2023-03-08 LAB — TROPONIN I (HIGH SENSITIVITY): Troponin I (High Sensitivity): 3 ng/L (ref <18)

## 2023-03-08 MED ORDER — ALBUTEROL SULFATE HFA 108 (90 BASE) MCG/ACT IN AERS
2.0000 | INHALATION_SPRAY | RESPIRATORY_TRACT | Status: DC | PRN
  Administered 2023-03-08: 2 via RESPIRATORY_TRACT
  Filled 2023-03-08: qty 6.7
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
## 2023-04-09 ENCOUNTER — Telehealth: Payer: Self-pay

## 2023-04-09 NOTE — Telephone Encounter (Signed)
Transition Care Management Unsuccessful Follow-up Telephone Call  Date of discharge and from where:  03/08/2023 [facility]  Attempts:  1st Attempt  Reason for unsuccessful TCM follow-up call:  Left voice message  Tam Savoia [facility]  [facility] Resource Care Guide Direct Dial: [phone]  Website: [email or web address]

## 2023-04-09 NOTE — Telephone Encounter (Signed)
Transition Care Management Unsuccessful Follow-up Telephone Call  Date of discharge and from where:  03/08/2023 [facility]  Attempts:  2nd Attempt  Reason for unsuccessful TCM follow-up call:  Left voice message  Tylan Briguglio [facility]  [facility], [facility] Resource Care Guide Direct Dial: [phone]  Website: [email or web address]

## 2023-06-30 ENCOUNTER — Emergency Department (HOSPITAL_COMMUNITY): Payer: Self-pay

## 2023-06-30 ENCOUNTER — Encounter (HOSPITAL_COMMUNITY): Payer: Self-pay

## 2023-06-30 ENCOUNTER — Emergency Department (HOSPITAL_COMMUNITY)
Admission: EM | Admit: 2023-06-30 | Discharge: 2023-06-30 | Disposition: A | Discharge status: Home / Self Care | Payer: Self-pay | Attending: Emergency Medicine | Admitting: Emergency Medicine

## 2023-06-30 ENCOUNTER — Other Ambulatory Visit: Payer: Self-pay

## 2023-06-30 DIAGNOSIS — M7989 Other specified soft tissue disorders: Secondary | ICD-10-CM | POA: Insufficient documentation

## 2023-06-30 DIAGNOSIS — S40011A Contusion of right shoulder, initial encounter: Secondary | ICD-10-CM | POA: Insufficient documentation

## 2023-06-30 DIAGNOSIS — W19XXXA Unspecified fall, initial encounter: Secondary | ICD-10-CM | POA: Insufficient documentation

## 2023-06-30 NOTE — ED Provider Notes (Signed)
[place] EMERGENCY DEPARTMENT AT [facility] Provider Note   CSN: [id number] Arrival date & time: 06/30/23  1315     History  Chief Complaint  Patient presents with   Nicole Ross is a 58 y.o. female.  58 year old female with prior medical history as detailed below presents for evaluation.  Patient reports accidental fall yesterday.  She tripped and fell forward.  She landed hard on her right shoulder.  She denies other injury.  She complains of diffuse anterior right shoulder pain.  She is ambulatory after the fall.  She uses her arms every day at work.  She is concerned that she may not be able to go to work tomorrow secondary to pain in the right shoulder.    The history is provided by the patient and medical records.       Home Medications Prior to Admission medications   Not on File      Allergies    Patient has no known allergies.    Review of Systems   Review of Systems  All other systems reviewed and are negative.   Physical Exam Updated Vital Signs BP (!) 148/93   Pulse 66   Temp 98.2 F (36.8 C) (Oral)   Resp 16   Ht 5\' 5"  (1.651 m)   Wt (!) 149.7 kg   LMP 10/02/2016 (Approximate) Comment: HCG < 5 10-06-16  SpO2 100%   BMI 54.91 kg/m  Physical Exam Vitals and nursing note reviewed.  Constitutional:      General: She is not in acute distress.    Appearance: Normal appearance. She is well-developed.  HENT:     Head: Normocephalic and atraumatic.  Eyes:     Conjunctiva/sclera: Conjunctivae normal.     Pupils: Pupils are equal, round, and reactive to light.  Cardiovascular:     Rate and Rhythm: Normal rate and regular rhythm.     Heart sounds: Normal heart sounds.  Pulmonary:     Effort: Pulmonary effort is normal. No respiratory distress.     Breath sounds: Normal breath sounds.  Abdominal:     General: There is no distension.     Palpations: Abdomen is soft.     Tenderness: There is no abdominal tenderness.   Musculoskeletal:        General: Tenderness present. No deformity. Normal range of motion.     Cervical back: Normal range of motion and neck supple.     Comments: Mild diffuse tenderness with palpation overlying the anterior aspect of the right shoulder.  No ecchymosis, abrasion, laceration.  Distal right upper extremity is neurovascular intact.  Skin:    General: Skin is warm and dry.  Neurological:     General: No focal deficit present.     Mental Status: She is alert and oriented to person, place, and time.     ED Results / Procedures / Treatments   Labs (all labs ordered are listed, but only abnormal results are displayed) Labs Reviewed - No data to display  EKG None  Radiology DG Shoulder Right Result Date: 06/30/2023 CLINICAL DATA:  Right shoulder pain after fall yesterday. EXAM: RIGHT SHOULDER - 2+ VIEW COMPARISON:  None Available. FINDINGS: There is no evidence of fracture or dislocation. Mild degenerative changes are seen involving right acromioclavicular and glenohumeral joints. Mild acromial spurring is noted. Soft tissues are unremarkable. IMPRESSION: Degenerative changes as noted above.  No acute abnormality seen. Electronically Signed   By: Roque Lias  Jr M.D.   On: 06/30/2023 14:39    Procedures Procedures    Medications Ordered in ED Medications - No data to display  ED Course/ Medical Decision Making/ A&P                                 Medical Decision Making Amount and/or Complexity of Data Reviewed Radiology: ordered.    Medical Screen Complete  This patient presented to the ED with complaint of fall, right shoulder pain.  This complaint involves an extensive number of treatment options. The initial differential diagnosis includes, but is not limited to, trauma related to fall  This presentation is: Acute, Self-Limited, Previously Undiagnosed, and Uncertain Prognosis  Patient reports that accidental fall that occurred yesterday.  Patient  complains of right shoulder pain secondary to fall.  Exam is not consistent with likely fracture.  Imaging does not show evidence of fracture.  Patient is reassured by workup and imaging results.  Patient would benefit from sling and close follow-up with orthopedics.  Patient otherwise of same.  Strict return precautions given understood.  Importance of close follow-up is stressed.  Additional history obtained:  External records from outside sources obtained and reviewed including prior ED visits and prior Inpatient records.   Imaging Studies ordered:  I ordered imaging studies including right shoulder x-ray I independently visualized and interpreted obtained imaging which showed NAD I agree with the radiologist interpretation.   Problem List / ED Course:   -Right shoulder contusion   Reevaluation:  After the interventions noted above, I reevaluated the patient and found that they have: improved  Disposition:  After consideration of the diagnostic results and the patients response to treatment, I feel that the patent would benefit from close outpatient follow-up.          Final Clinical Impression(s) / ED Diagnoses Final diagnoses:  Fall, initial encounter  Contusion of right shoulder, initial encounter    Rx / DC Orders ED Discharge Orders     None         Wynetta Fines, MD 06/30/23 [phone]

## 2023-06-30 NOTE — ED Triage Notes (Signed)
Patient reports she tripped and fell yesterday landing on her right shoulder. Did not hit head, no LOC, not on blood thinners. Denies any other injuries.

## 2023-06-30 NOTE — Discharge Instructions (Signed)
 Return for any problem.  ?

## 2023-06-30 NOTE — Progress Notes (Signed)
Orthopedic Tech Progress Note Patient Details:  Nicole Ross [birth year] [id number]  Ortho Devices Type of Ortho Device: Shoulder immobilizer Ortho Device/Splint Location: RUE Ortho Device/Splint Interventions: Ordered, Application, Adjustment   Post Interventions Patient Tolerated: Well Instructions Provided: Adjustment of device, Care of device  Tonye Pearson 06/30/2023, 4:08 PM

## 2023-09-01 ENCOUNTER — Other Ambulatory Visit: Payer: Self-pay

## 2023-09-01 ENCOUNTER — Emergency Department (HOSPITAL_COMMUNITY): Payer: Self-pay

## 2023-09-01 ENCOUNTER — Encounter (HOSPITAL_COMMUNITY): Payer: Self-pay | Admitting: Emergency Medicine

## 2023-09-01 ENCOUNTER — Emergency Department (HOSPITAL_COMMUNITY)
Admission: EM | Admit: 2023-09-01 | Discharge: 2023-09-01 | Disposition: A | Discharge status: Home / Self Care | Payer: Self-pay | Attending: Emergency Medicine | Admitting: Emergency Medicine

## 2023-09-01 DIAGNOSIS — R1030 Lower abdominal pain, unspecified: Secondary | ICD-10-CM

## 2023-09-01 DIAGNOSIS — N950 Postmenopausal bleeding: Secondary | ICD-10-CM | POA: Insufficient documentation

## 2023-09-01 LAB — URINALYSIS, ROUTINE W REFLEX MICROSCOPIC
Bilirubin Urine: NEGATIVE
Glucose, UA: NEGATIVE mg/dL
Hgb urine dipstick: NEGATIVE
Ketones, ur: NEGATIVE mg/dL
Leukocytes,Ua: NEGATIVE
Nitrite: NEGATIVE
Protein, ur: NEGATIVE mg/dL
Specific Gravity, Urine: 1.004 — ABNORMAL LOW (ref 1.005–1.030)
pH: 6 (ref 5.0–8.0)

## 2023-09-01 LAB — COMPREHENSIVE METABOLIC PANEL WITH GFR
ALT: 18 U/L (ref 0–44)
AST: 21 U/L (ref 15–41)
Albumin: 3.7 g/dL (ref 3.5–5.0)
Alkaline Phosphatase: 57 U/L (ref 38–126)
Anion gap: 7 (ref 5–15)
BUN: 10 mg/dL (ref 6–20)
CO2: 26 mmol/L (ref 22–32)
Calcium: 9.6 mg/dL (ref 8.9–10.3)
Chloride: 105 mmol/L (ref 98–111)
Creatinine, Ser: 0.8 mg/dL (ref 0.44–1.00)
GFR, Estimated: >60 mL/min (ref >60)
Glucose, Bld: 110 mg/dL — ABNORMAL HIGH (ref 70–99)
Potassium: 3.8 mmol/L (ref 3.5–5.1)
Sodium: 138 mmol/L (ref 135–145)
Total Bilirubin: 0.4 mg/dL (ref 0.0–1.2)
Total Protein: 7.9 g/dL (ref 6.5–8.1)

## 2023-09-01 LAB — CBC WITH DIFFERENTIAL/PLATELET
Abs Immature Granulocytes: 0.04 10*3/uL (ref 0.00–0.07)
Basophils Absolute: 0 10*3/uL (ref 0.0–0.1)
Basophils Relative: 0 %
Eosinophils Absolute: 0.1 10*3/uL (ref 0.0–0.5)
Eosinophils Relative: 1 %
HCT: 40.6 % (ref 36.0–46.0)
Hemoglobin: 13.4 g/dL (ref 12.0–15.0)
Immature Granulocytes: 1 %
Lymphocytes Relative: 32 %
Lymphs Abs: 2.5 10*3/uL (ref 0.7–4.0)
MCH: 32.3 pg (ref 26.0–34.0)
MCHC: 33 g/dL (ref 30.0–36.0)
MCV: 97.8 fL (ref 80.0–100.0)
Monocytes Absolute: 0.9 10*3/uL (ref 0.1–1.0)
Monocytes Relative: 11 %
Neutro Abs: 4.2 10*3/uL (ref 1.7–7.7)
Neutrophils Relative %: 55 %
Platelets: 216 10*3/uL (ref 150–400)
RBC: 4.15 MIL/uL (ref 3.87–5.11)
RDW: 15.2 % (ref 11.5–15.5)
WBC: 7.7 10*3/uL (ref 4.0–10.5)
nRBC: 0 % (ref 0.0–0.2)

## 2023-09-01 LAB — LIPASE, BLOOD: Lipase: 25 U/L (ref 11–51)

## 2023-09-01 MED ORDER — IOHEXOL 300 MG/ML  SOLN
100.0000 mL | Freq: Once | INTRAMUSCULAR | Status: AC | PRN
  Administered 2023-09-01: 100 mL via INTRAVENOUS

## 2023-09-01 MED ORDER — SODIUM CHLORIDE (PF) 0.9 % IJ SOLN
INTRAMUSCULAR | Status: AC
  Filled 2023-09-01: qty 50

## 2023-09-01 NOTE — ED Triage Notes (Signed)
 Pt to ED from home c/o generalized abd pain and right lower back pain since yesterday.  Denies urinary changes but has had constipation.  Last BM yesterday.  States family hx of stomach cancer and is worried.

## 2023-09-01 NOTE — ED Provider Notes (Incomplete)
 [place] EMERGENCY DEPARTMENT AT [facility] Provider Note   CSN: [id number] Arrival date & time: 09/01/23  4098     History {Add pertinent medical, surgical, social history, OB history to HPI:1} Chief Complaint  Patient presents with   Abdominal Pain   Back Pain    Nicole Ross is a 58 y.o. female.   Abdominal Pain Back Pain Associated symptoms: abdominal pain        Home Medications Prior to Admission medications   Not on File      Allergies    Patient has no known allergies.    Review of Systems   Review of Systems  Gastrointestinal:  Positive for abdominal pain.  Musculoskeletal:  Positive for back pain.    Physical Exam Updated Vital Signs BP (!) 170/98 (BP Location: Left Arm)   Pulse 60   Temp 98.2 F (36.8 C) (Oral)   Resp 16   Ht 5\' 5"  (1.651 m)   Wt (!) 149 kg   LMP 10/02/2016 (Approximate) Comment: HCG < 5 10-06-16  SpO2 99%   BMI 54.66 kg/m  Physical Exam  ED Results / Procedures / Treatments   Labs (all labs ordered are listed, but only abnormal results are displayed) Labs Reviewed  COMPREHENSIVE METABOLIC PANEL WITH GFR - Abnormal; Notable for the following components:      Result Value   Glucose, Bld 110 (*)    All other components within normal limits  URINALYSIS, ROUTINE W REFLEX MICROSCOPIC - Abnormal; Notable for the following components:   Color, Urine STRAW (*)    Specific Gravity, Urine 1.004 (*)    All other components within normal limits  LIPASE, BLOOD  CBC WITH DIFFERENTIAL/PLATELET    EKG EKG Interpretation Date/Time:  Sunday September 01 2023 05:56:58 EDT Ventricular Rate:  52 PR Interval:  136 QRS Duration:  87 QT Interval:  428 QTC Calculation: 398 R Axis:   33  Text Interpretation: Sinus rhythm Confirmed by Zadie Rhine ([id number]) on 09/01/2023 5:59:39 AM  Radiology US Pelvis Complete Result Date: 09/01/2023 CLINICAL DATA:  Postmenopausal bleeding. EXAM: TRANSABDOMINAL AND TRANSVAGINAL ULTRASOUND  OF PELVIS DOPPLER ULTRASOUND OF OVARIES TECHNIQUE: Both transabdominal and transvaginal ultrasound examinations of the pelvis were performed. Transabdominal technique was performed for global imaging of the pelvis including uterus, ovaries, adnexal regions, and pelvic cul-de-sac. It was necessary to proceed with endovaginal exam following the transabdominal exam to visualize the endometrium. Color and duplex Doppler ultrasound was utilized to evaluate blood flow to the ovaries. COMPARISON:  None Available. FINDINGS: Uterus Measurements: 13.7 x 8.1 x 7.0 = volume: 405.3 mL. Multiple heterogeneous fibroids are present. The largest is in scratched at the largest is an anterior myometrial fibroid measuring 6.2 x 7.06.5 cm. Two smaller lesions posteriorly measure 4.3 x 4.2 x 3.7 cm and 2.4 x 3.2 x 3.1 cm respectively. Endometrium Thickness: 12.0.  No focal abnormality visualized. Right ovary Not visualized Left ovary Measurements: 3.8 x 3.2 x 2.6 cm = volume: 16.5 mL. Normal appearance/no adnexal mass. Pulsed Doppler evaluation of the left ovary demonstrates normal low-resistance arterial and venous waveforms. Other findings No abnormal free fluid. IMPRESSION: 1. Multiple uterine fibroids. 2. Thickened endometrium without focal abnormality. 3. Nonvisualization of the right ovary. 4. Normal sonographic appearance of the left ovary. Electronically Signed   By: Marin Roberts M.D.   On: 09/01/2023 10:26   US Transvaginal Non-OB Result Date: 09/01/2023 CLINICAL DATA:  Postmenopausal bleeding. EXAM: TRANSABDOMINAL AND TRANSVAGINAL ULTRASOUND OF PELVIS DOPPLER ULTRASOUND OF  OVARIES TECHNIQUE: Both transabdominal and transvaginal ultrasound examinations of the pelvis were performed. Transabdominal technique was performed for global imaging of the pelvis including uterus, ovaries, adnexal regions, and pelvic cul-de-sac. It was necessary to proceed with endovaginal exam following the transabdominal exam to visualize the  endometrium. Color and duplex Doppler ultrasound was utilized to evaluate blood flow to the ovaries. COMPARISON:  None Available. FINDINGS: Uterus Measurements: 13.7 x 8.1 x 7.0 = volume: 405.3 mL. Multiple heterogeneous fibroids are present. The largest is in scratched at the largest is an anterior myometrial fibroid measuring 6.2 x 7.06.5 cm. Two smaller lesions posteriorly measure 4.3 x 4.2 x 3.7 cm and 2.4 x 3.2 x 3.1 cm respectively. Endometrium Thickness: 12.0.  No focal abnormality visualized. Right ovary Not visualized Left ovary Measurements: 3.8 x 3.2 x 2.6 cm = volume: 16.5 mL. Normal appearance/no adnexal mass. Pulsed Doppler evaluation of the left ovary demonstrates normal low-resistance arterial and venous waveforms. Other findings No abnormal free fluid. IMPRESSION: 1. Multiple uterine fibroids. 2. Thickened endometrium without focal abnormality. 3. Nonvisualization of the right ovary. 4. Normal sonographic appearance of the left ovary. Electronically Signed   By: Marin Roberts M.D.   On: 09/01/2023 10:26   [place] Art/Ven Flow Abd Pelv Doppler Result Date: 09/01/2023 CLINICAL DATA:  Postmenopausal bleeding. EXAM: TRANSABDOMINAL AND TRANSVAGINAL ULTRASOUND OF PELVIS DOPPLER ULTRASOUND OF OVARIES TECHNIQUE: Both transabdominal and transvaginal ultrasound examinations of the pelvis were performed. Transabdominal technique was performed for global imaging of the pelvis including uterus, ovaries, adnexal regions, and pelvic cul-de-sac. It was necessary to proceed with endovaginal exam following the transabdominal exam to visualize the endometrium. Color and duplex Doppler ultrasound was utilized to evaluate blood flow to the ovaries. COMPARISON:  None Available. FINDINGS: Uterus Measurements: 13.7 x 8.1 x 7.0 = volume: 405.3 mL. Multiple heterogeneous fibroids are present. The largest is in scratched at the largest is an anterior myometrial fibroid measuring 6.2 x 7.06.5 cm. Two smaller lesions  posteriorly measure 4.3 x 4.2 x 3.7 cm and 2.4 x 3.2 x 3.1 cm respectively. Endometrium Thickness: 12.0.  No focal abnormality visualized. Right ovary Not visualized Left ovary Measurements: 3.8 x 3.2 x 2.6 cm = volume: 16.5 mL. Normal appearance/no adnexal mass. Pulsed Doppler evaluation of the left ovary demonstrates normal low-resistance arterial and venous waveforms. Other findings No abnormal free fluid. IMPRESSION: 1. Multiple uterine fibroids. 2. Thickened endometrium without focal abnormality. 3. Nonvisualization of the right ovary. 4. Normal sonographic appearance of the left ovary. Electronically Signed   By: Marin Roberts M.D.   On: 09/01/2023 10:26   CT ABDOMEN PELVIS W CONTRAST Result Date: 09/01/2023 CLINICAL DATA:  Generalized abdominal pain. EXAM: CT ABDOMEN AND PELVIS WITH CONTRAST TECHNIQUE: Multidetector CT imaging of the abdomen and pelvis was performed using the standard protocol following bolus administration of intravenous contrast. RADIATION DOSE REDUCTION: This exam was performed according to the departmental dose-optimization program which includes automated exposure control, adjustment of the mA and/or kV according to patient size and/or use of iterative reconstruction technique. CONTRAST:  OMNIPAQUE IOHEXOL 300 MG/ML  SOLN COMPARISON:  10/06/2016 FINDINGS: Lower chest: Ill-defined subpleural opacity/airspace disease in the paraspinal left lower lobe (image 10/series 6) may be atelectasis although pneumonia could have this appearance. No pleural effusion. Hepatobiliary: No suspicious focal abnormality within the liver parenchyma. Cholecystectomy. No intrahepatic or extrahepatic biliary dilation. Pancreas: No focal mass lesion. No dilatation of the main duct. No intraparenchymal cyst. No peripancreatic edema. Spleen: No splenomegaly. No suspicious focal mass  lesion. Adrenals/Urinary Tract: Multiple left adrenal nodules evident. Dominant 2.6 cm left adrenal nodule (27/2) is  stable since the prior study. 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 A second 2.6 cm left adrenal nodule seen on 24/2 was 2.3 cm previously, also consistent with benign etiology. No followup imaging is recommended. 1.3 cm right adrenal nodule on 23/2 was 2.1 cm previously, also consistent with benign process. No followup imaging is recommended. Kidneys unremarkable. No evidence for hydroureter. The urinary bladder appears normal for the degree of distention. Stomach/Bowel: Stomach is unremarkable. No gastric wall thickening. No evidence of outlet obstruction. Duodenum is normally positioned as is the ligament of Treitz. No small bowel wall thickening. No small bowel dilatation. The terminal ileum is normal. The appendix is normal. No gross colonic mass. No colonic wall thickening. Diverticular changes are noted in the left colon without evidence of diverticulitis. Vascular/Lymphatic: There is mild atherosclerotic calcification of the abdominal aorta without aneurysm. There is no gastrohepatic or hepatoduodenal ligament lymphadenopathy. No retroperitoneal or mesenteric lymphadenopathy. No pelvic sidewall lymphadenopathy. Reproductive: Multiple uterine fibroids evident. There is no adnexal mass. Other: No intraperitoneal free fluid. Musculoskeletal: No worrisome lytic or sclerotic osseous abnormality. IMPRESSION: 1. No acute findings in the abdomen or pelvis. Specifically, no findings to explain the patient's history of abdominal pain. 2. Ill-defined subpleural opacity/airspace disease in the paraspinal left lower lobe may be atelectasis although pneumonia could have this appearance. 3. Multiple uterine fibroids. 4. Left colonic diverticulosis without diverticulitis. 5. 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 No followup  imaging is recommended. 6.  Aortic Atherosclerois (ICD10-170.0) Electronically Signed   By: Kennith Center M.D.   On: 09/01/2023 07:32    Procedures Procedures  {Document cardiac monitor, telemetry assessment procedure when appropriate:1}  Medications Ordered in ED Medications  iohexol (OMNIPAQUE) 300 MG/ML solution 100 mL (100 mLs Intravenous Contrast Given 09/01/23 0715)    ED Course/ Medical Decision Making/ A&P   {   Click here for ABCD2, HEART and other calculatorsREFRESH Note before signing :1}                              Medical Decision Making Amount and/or Complexity of Data Reviewed Radiology: ordered.   ***  {Document critical care time when appropriate:1} {Document review of labs and clinical decision tools ie heart score, Chads2Vasc2 etc:1}  {Document your independent review of radiology images, and any outside records:1} {Document your discussion with family members, caretakers, and with consultants:1} {Document social determinants of health affecting pt's care:1} {Document your decision making why or why not admission, treatments were needed:1} Final Clinical Impression(s) / ED Diagnoses Final diagnoses:  Post-menopausal bleeding  Lower abdominal pain    Rx / DC Orders ED Discharge Orders     None

## 2023-09-01 NOTE — Discharge Instructions (Addendum)
 You were seen in the ER today for evaluation of your lower abdominal pain. Your CT scan and US shows that you have multiple fibroids which could be causing your pain. It is very abnormal to have vaginal bleeding after menopause. It is EXTREMELY important to make sure that you follow up with a gynecologist. I have listed the information for a practice into the discharge paperwork for you to follow up with. I have also listed a primary care provider. You will need to call both to schedule appointments. For pain, I recommend taking 600mg  of ibuprofen and 1000mg  of Tylenol every 6 hours as needed for pain. If you have any concerns, new or worsening symptoms, please return to the ER for re-evaluation.   Contact a doctor if: Your belly pain changes or gets worse. You have very bad cramping or bloating in your belly. You vomit. Your pain gets worse with meals, after eating, or with certain foods. You have trouble pooping or have watery poop for more than 2-3 days. You are not hungry, or you lose weight without trying. You have signs of not getting enough fluid or water (dehydration). These may include: Dark pee, very little pee, or no pee. Cracked lips or dry mouth. Feeling sleepy or weak. You have pain when you pee or poop. Your belly pain wakes you up at night. You have blood in your pee. You have a fever. Get help right away if: You cannot stop vomiting. Your pain is only in one part of your belly, like on the right side. You have bloody or black poop, or poop that looks like tar. You have trouble breathing. You have chest pain. These symptoms may be an emergency. Get help right away. Call 911. Do not wait to see if the symptoms will go away. Do not drive yourself to the hospital.

## 2024-04-29 NOTE — Progress Notes (Unsigned)
   Subjective:    Patient ID: Nicole Ross, female    DOB: [birth year], 58 y.o.   MRN: [id number]   CC: New Patient  HPI: PMHx: No past medical history on file.   Surgical Hx: Past Surgical History:  Procedure Laterality Date   CHOLECYSTECTOMY N/A 10/18/2014   Procedure: LAPAROSCOPIC CHOLECYSTECTOMY WITH INTRAOPERATIVE CHOLANGIOGRAM;  Surgeon: Elon Pacini, MD;  Location: [facility];  Service: General;  Laterality: N/A;   REPAIR EXTENSOR TENDON Right 12/07/2019   Procedure: REALIGNMENT OF RIGHT INDEX FINGER EXTENSOR TENDON;  Surgeon: Sebastian Lenis, MD;  Location: [facility];  Service: Orthopedics;  Laterality: Right;  PROCEDURE: REALIGNMENT OF RIGHT INDEX FINGER EXTENSOR TENDON  LENGTH OF SURGERY: 45 MIN     Family Hx: No family history on file.   Social Hx: Current Social History 04/29/2024   Who lives at home: ***  Who would speak for you about health care matters: ***  Living will or Advance Directive: *** Transportation: ***  Important Relationships & Pets: ***  Current Stressors: ***  Work / Education:  *** Religious / Personal Beliefs: ***  Interests / Fun: ***  Other: ***    Medications:   ROS: Woman:  Patient reports no  vision/ hearing changes,anorexia, weight change, fever ,adenopathy, persistant / recurrent hoarseness, swallowing issues, chest pain, edema,persistant / recurrent cough, hemoptysis, dyspnea(rest, exertional, paroxysmal nocturnal), gastrointestinal  bleeding (melena, rectal bleeding), abdominal pain, excessive heart burn, GU symptoms(dysuria, hematuria, pyuria, voiding/incontinence  Issues) syncope, focal weakness, severe memory loss, concerning skin lesions, depression, anxiety, abnormal bruising/bleeding, major joint swelling, breast masses or abnormal vaginal bleeding.    Man:  Patient reports no  vision/ hearing changes,anorexia, weight change, fever ,adenopathy, persistant / recurrent hoarseness, swallowing issues, chest pain,  edema,persistant / recurrent cough, hemoptysis, dyspnea(rest, exertional, paroxysmal nocturnal), gastrointestinal  bleeding (melena, rectal bleeding), abdominal pain, excessive heart burn, GU symptoms(dysuria, hematuria, pyuria, voiding/incontinence  Issues) syncope, focal weakness, severe memory loss, concerning skin lesions, depression, anxiety, abnormal bruising/bleeding, major joint swelling.    Preventative Screening Colonoscopy: *** Mammogram:*** Pap test: *** PSA:*** DEXA:*** Tetanus vaccine: *** Pneumonia vaccine:*** Shingles vaccine:*** Heart stress test:*** Echocardiogram:***  Smoking status reviewed   Objective:  LMP 10/02/2016 (Approximate) Comment: HCG < 5 10-06-16 Vitals and nursing note reviewed  General: well nourished, in no acute distress HEENT: normocephalic, TM's visualized bilaterally, no scleral icterus or conjunctival pallor, no nasal discharge, moist mucous membranes, good dentition without erythema or discharge noted in posterior oropharynx Neck: supple, non-tender, without lymphadenopathy Cardiac: RRR, clear S1 and S2, no murmurs, rubs, or gallops Respiratory: clear to auscultation bilaterally, no increased work of breathing Abdomen: soft, nontender, nondistended, no masses or organomegaly. Bowel sounds present Extremities: no edema or cyanosis. Warm, well perfused. 2+ radial and PT pulses bilaterally Skin: warm and dry, no rashes noted Neuro: alert and oriented, no focal deficits   Assessment & Plan:    No problem-specific Assessment & Plan notes found for this encounter.    No follow-ups on file.  Gladis Church, DO PGY-***

## 2024-04-30 ENCOUNTER — Encounter: Payer: Self-pay | Admitting: Family Medicine

## 2024-04-30 ENCOUNTER — Ambulatory Visit: Payer: Self-pay | Admitting: Family Medicine

## 2024-04-30 VITALS — BP 137/81 | HR 68 | Ht 65.0 in | Wt 302.8 lb

## 2024-04-30 DIAGNOSIS — Z6841 Body Mass Index (BMI) 40.0 and over, adult: Secondary | ICD-10-CM | POA: Insufficient documentation

## 2024-04-30 DIAGNOSIS — Z113 Encounter for screening for infections with a predominantly sexual mode of transmission: Secondary | ICD-10-CM

## 2024-04-30 DIAGNOSIS — Z1231 Encounter for screening mammogram for malignant neoplasm of breast: Secondary | ICD-10-CM

## 2024-04-30 DIAGNOSIS — F17200 Nicotine dependence, unspecified, uncomplicated: Secondary | ICD-10-CM | POA: Insufficient documentation

## 2024-04-30 DIAGNOSIS — E66813 Obesity, class 3: Secondary | ICD-10-CM

## 2024-04-30 DIAGNOSIS — Z1211 Encounter for screening for malignant neoplasm of colon: Secondary | ICD-10-CM

## 2024-04-30 DIAGNOSIS — L84 Corns and callosities: Secondary | ICD-10-CM

## 2024-04-30 NOTE — Patient Instructions (Addendum)
 It was wonderful to see you today! Thank you for choosing [facility].   Please bring ALL of your medications with you to every visit.   Today we talked about:  It was very nice to meet you today!  As we discussed I ordered some blood work since you have not had any done in many years and did refer you for a mammogram and colonoscopy.  We have other screening recommendations we can follow-up on at your next appointment to do your Pap smear and talk about lung cancer screening.  If you sign up for MyChart you can see all of the lab results online which is helpful.  I was call you if there is anything abnormal that needs medication or follow-up. I am glad that you want to stop smoking!  The most cost effective way to get it done is to call the 1 800 quit line as they will send you free nicotine replacement products.  I would recommend getting the patch and trying something such as the gum or lozenge to help replace the behavior.  Also set quit goals for yourself as to how many cigarettes he would like to get down to by the end of the month or the end of the year.  As we discussed you can also meet with our pharmacist who helps with smoking cessation accountability. I also referred you to podiatry for the area on your right foot as they can do some more deeper debridement for you.  Our office will follow-up with you regarding this referral and get that information. For your knee pain you can also use the topical Voltaren gel which is an anti-inflammatory.  I also recommend wearing compression stockings and good shoes while at work standing all day.  Please follow up in the next month for Pap smear and follow-up  If you haven't already, sign up for My Chart to have easy access to your labs results, and communication with your primary care physician.   We are checking some labs today. If they are abnormal, I will call you. If they are normal, I will send you a MyChart message (if it is  active) or a letter in the mail. If you do not hear about your labs in the next 2 weeks, please call the office.  Call the clinic at [phone] if your symptoms worsen or you have any concerns.  Please be sure to schedule follow up at the front desk before you leave today.   Izetta Nap, DO Family Medicine     Dental list        [facility]     [phone] [address] [place].  [address] [place] [address] Se habla espaol From 43 to 12 years old Parent may go with child only for cleaning Dorise Rouleau DDS     [phone] [place], DDS (Spanish speaking) [address] [place]  [address] Se habla espaol From 47 to 25 years old Parent may go with child   Nikki armin Nikki DMD    [phone] [address] [place] [address] Se habla espaol Vietnamese spoken From 41 years old Parent may go with child [facility]     [phone] [address] [place] [address] Se habla espaol From 75 to 81 years old Parent may NOT go with child  Deleta Norcross DDS  [phone] [facility]      [address]  [place] [address] Se habla  espaol Vietnamese spoken (preferred to bring translator) From teeth coming in to 30 years old Parent may go with child  [facility] Dept.     [phone] [address] [place]. [place] [address] Requires certification. Call for information. Requiere certificacin. Llame para informacin. Algunos dias se habla espaol  From birth to 20 years Parent possibly goes with child   Elza Hamburger DDS     [phone] [address]  [address] [place] [address] Se habla espaol From 18 months to 18 years  Parent may go with child  J. [facility] DDS     Camellia DOROTHA Cagey DDS  [phone] [address] [place] [address] [place] [address] Se habla espaol From 56 year old Parent may go with child   Abran Kenner DDS    [phone] [address] [place]  [place] [address] Se habla espaol  From 18 months to 67 years old Parent may go with child DOROTHA Prince Fell DDS    [phone] [address] [place] [address] Se habla espaol From 16 to 16 years old Parent may go with child  [facility]    [phone] [address] [place] [address] No se breck conte From birth [facility]  [phone] [address] [place] [address] Se habla espanol Interpretation for other languages Special needs children welcome  [place], DDS PA     [phone] [address]  [place], [place] [address] From 56 years old   Special needs children welcome  [facility]   [phone] Dr. Sona Isharani [address] [place], [place] [address] Se habla espaol From birth to 12 years Special needs children welcome   [facility] [place] [phone] [address] [place], [place] [address]   [facility] [place] [phone] [address] [place], [place] [address]        insurance options   [facility]  - DEPARTMENT OF SOCIAL SERVICES: [address] [place], [place] [address]    [phone]   [address] [email or web address]   Call [phone]   [facility]   [email or web address]   [phone]    [employer] Card- Pick up application at the front desk along with cone financial assistance packet   Who Does the [facility] Serve? [facility] serves any patient with an income between 0%-200% of the federal poverty level.  [facility] patients must be exempt from the Affordable Care Act in order to enroll into the [facility].     Medication Assistance Program (MAP)-     [address] [phone]      [address], [place] [phone]

## 2024-04-30 NOTE — Assessment & Plan Note (Signed)
 40+ pack-year history, currently smoking 10 cigarettes/day and interested in quitting.  Still working out insurance, recommend calling the 1 800 quit line to get free nicotine replacement products.  Encourage patient to set goals and schedule for quitting.

## 2024-04-30 NOTE — Assessment & Plan Note (Signed)
 Has not been seen by medical care in many years, unclear if comorbidities present as possible family history of DM. -A1c, lipid panel, BMP

## 2024-05-01 LAB — BASIC METABOLIC PANEL WITH GFR
BUN/Creatinine Ratio: 18 (ref 9–23)
BUN: 16 mg/dL (ref 6–24)
CO2: 25 mmol/L (ref 20–29)
Calcium: 10.2 mg/dL (ref 8.7–10.2)
Chloride: 101 mmol/L (ref 96–106)
Creatinine, Ser: 0.89 mg/dL (ref 0.57–1.00)
Glucose: 86 mg/dL (ref 70–99)
Potassium: 4.3 mmol/L (ref 3.5–5.2)
Sodium: 141 mmol/L (ref 134–144)
eGFR: 76 mL/min/{1.73_m2}

## 2024-05-01 LAB — LIPID PANEL
Chol/HDL Ratio: 2.7 ratio (ref 0.0–4.4)
Cholesterol, Total: 204 mg/dL — ABNORMAL HIGH (ref 100–199)
HDL: 75 mg/dL
LDL Chol Calc (NIH): 114 mg/dL — ABNORMAL HIGH (ref 0–99)
Triglycerides: 86 mg/dL (ref 0–149)
VLDL Cholesterol Cal: 15 mg/dL (ref 5–40)

## 2024-05-01 LAB — HIV ANTIBODY (ROUTINE TESTING W REFLEX): HIV Screen 4th Generation wRfx: NONREACTIVE

## 2024-05-01 LAB — SYPHILIS: RPR W/REFLEX TO RPR TITER AND TREPONEMAL ANTIBODIES, TRADITIONAL SCREENING AND DIAGNOSIS ALGORITHM: RPR Ser Ql: NONREACTIVE

## 2024-05-01 LAB — HEPATITIS C ANTIBODY: Hep C Virus Ab: NONREACTIVE

## 2024-05-05 ENCOUNTER — Ambulatory Visit: Payer: Self-pay | Admitting: Family Medicine

## 2024-06-29 ENCOUNTER — Encounter: Payer: Self-pay | Admitting: Gastroenterology
